# Patient Record
Sex: Female | Born: 1980 | Race: White | Hispanic: No | Smoking: Never smoker
Health system: Southern US, Community
[De-identification: ages and names within clinical notes are randomized; demographics above are authoritative.]

## PROBLEM LIST (undated history)

## (undated) DIAGNOSIS — R112 Nausea with vomiting, unspecified: Secondary | ICD-10-CM

## (undated) DIAGNOSIS — Z9889 Other specified postprocedural states: Secondary | ICD-10-CM

## (undated) DIAGNOSIS — E282 Polycystic ovarian syndrome: Secondary | ICD-10-CM

## (undated) DIAGNOSIS — N201 Calculus of ureter: Secondary | ICD-10-CM

## (undated) HISTORY — DX: Polycystic ovarian syndrome: E28.2

## (undated) HISTORY — PX: OTHER SURGICAL HISTORY: SHX169

## (undated) HISTORY — DX: Nausea with vomiting, unspecified: R11.2

## (undated) HISTORY — DX: Nausea with vomiting, unspecified: Z98.890

---

## 1998-01-11 ENCOUNTER — Other Ambulatory Visit: Admission: RE | Admit: 1998-01-11 | Discharge: 1998-01-11 | Payer: Self-pay | Admitting: Gynecology

## 1998-07-01 ENCOUNTER — Emergency Department (HOSPITAL_COMMUNITY): Admission: EM | Admit: 1998-07-01 | Discharge: 1998-07-01 | Payer: Self-pay | Admitting: Emergency Medicine

## 2002-08-11 ENCOUNTER — Emergency Department (HOSPITAL_COMMUNITY): Admission: EM | Admit: 2002-08-11 | Discharge: 2002-08-11 | Payer: Self-pay | Admitting: *Deleted

## 2002-08-11 ENCOUNTER — Encounter: Payer: Self-pay | Admitting: *Deleted

## 2003-10-05 ENCOUNTER — Other Ambulatory Visit: Admission: RE | Admit: 2003-10-05 | Discharge: 2003-10-05 | Payer: Self-pay | Admitting: Gynecology

## 2004-03-09 ENCOUNTER — Inpatient Hospital Stay (HOSPITAL_COMMUNITY): Admission: AD | Admit: 2004-03-09 | Discharge: 2004-03-09 | Payer: Self-pay | Admitting: Obstetrics and Gynecology

## 2004-05-10 ENCOUNTER — Encounter (INDEPENDENT_AMBULATORY_CARE_PROVIDER_SITE_OTHER): Payer: Self-pay | Admitting: *Deleted

## 2004-05-10 ENCOUNTER — Inpatient Hospital Stay (HOSPITAL_COMMUNITY): Admission: AD | Admit: 2004-05-10 | Discharge: 2004-05-12 | Payer: Self-pay | Admitting: Internal Medicine

## 2004-05-13 ENCOUNTER — Encounter: Admission: RE | Admit: 2004-05-13 | Discharge: 2004-06-12 | Payer: Self-pay | Admitting: Obstetrics and Gynecology

## 2004-07-30 ENCOUNTER — Emergency Department (HOSPITAL_COMMUNITY): Admission: EM | Admit: 2004-07-30 | Discharge: 2004-07-30 | Payer: Self-pay | Admitting: Emergency Medicine

## 2006-01-02 ENCOUNTER — Other Ambulatory Visit: Admission: RE | Admit: 2006-01-02 | Discharge: 2006-01-02 | Payer: Self-pay | Admitting: Obstetrics and Gynecology

## 2008-09-29 ENCOUNTER — Emergency Department (HOSPITAL_COMMUNITY): Admission: EM | Admit: 2008-09-29 | Discharge: 2008-09-29 | Payer: Self-pay | Admitting: Emergency Medicine

## 2010-07-01 HISTORY — PX: BREAST SURGERY: SHX581

## 2010-10-10 LAB — WOUND CULTURE: Gram Stain: NONE SEEN

## 2010-11-13 NOTE — Consult Note (Signed)
[name], [name]             ACCOUNT NO.:  [id number]   MEDICAL RECORD NO.:  [id number]          PATIENT TYPE:  EMS   LOCATION:  ED                           FACILITY:  [facility]   PHYSICIAN:  Lorne Skeens. Hoxworth, M.D.DATE OF BIRTH:  01-May-1981   DATE OF CONSULTATION:  09/29/2008  DATE OF DISCHARGE:                                 CONSULTATION   REFERRING PHYSICIAN:  Nelva Nay, MD.   CHIEF COMPLAINT:  Pain, redness, swelling lower back.   HISTORY OF PRESENT ILLNESS:  Ms. [name] is a generally healthy 30-  year-old female who presents with 4 days of increasing redness, swelling  and discomfort over her lower back.  She has noted several small  pustules.  She has a history of a tattoo across her lower back about 2  weeks ago.  She saw Dr. Timothy Lasso in the office today and two smaller areas  were unroofed but the larger third apparent subcu abscess he was unable  to drain in the office and therefore she is seen in the emergency room  for further evaluation and treatment.  She had not noted any fever or  chills.  She has no personal history of MRSA infection, although her  child had an infection a couple of years ago with MRSA.   PAST MEDICAL HISTORY:  Generally unremarkable without serious illness.  She has had a pilonidal cyst excised.   MEDICATION:  Only birth control pills.   ALLERGIES:  ERYTHROMYCIN.   SOCIAL HISTORY:  She is married, employed.  Denies cigarettes, alcohol.   FAMILY HISTORY:  Noncontributory.   PHYSICAL EXAM:  Temperature is 99.  VITAL SIGNS:  Within the limits.  GENERAL:  She is mildly overweight, in no acute distress.  SKIN:  Pertinent findings were limited to the skin.  Over the lower back  are two small pustules which have been previously opened and about a  centimeter or two of surrounding erythema.  Lower and just to the right  of midline in the low back is another pustule about 5 mm in diameter but  a significant about 10 cm surrounding area  of induration and erythema.   ASSESSMENT/PLAN:  Apparent abscess lower back entirely consistent with  methicillin-resistant Staphylococcus aureus.  The patient has been given  vancomycin 1 gram intravenously.  We will plan irrigation and  debridement under local anesthesia in the emergency room.      Lorne Skeens. Hoxworth, M.D.  Electronically Signed     BTH/MEDQ  D:  09/29/2008  T:  09/29/2008  Job:  [id number]   cc:   Gwen Pounds, MD  Fax: [phone]

## 2010-11-13 NOTE — Op Note (Signed)
[name], [name]             ACCOUNT NO.:  [id number]   MEDICAL RECORD NO.:  [id number]          PATIENT TYPE:  EMS   LOCATION:  ED                           FACILITY:  [facility]   PHYSICIAN:  Lorne Skeens. Hoxworth, M.D.DATE OF BIRTH:  March 12, 1981   DATE OF PROCEDURE:  09/29/2008  DATE OF DISCHARGE:  09/29/2008                               OPERATIVE REPORT   DIAGNOSIS:  Subcutaneous abscess of the back, probable Methicillin-  resistant Staphylococcus aureus.   HISTORY:  This patient is a 30 year old female who presents with 4 days  of increasing redness, swelling and pain in her lower back.  She had a  10-cm area of induration and erythema, with a central small pustule --  consistent with MRSA abscess.  I have recommended proceeding with  incision and drainage under local anesthesia in the emergency room.   DESCRIPTION OF THE PROCEDURE:  The patient had received vancomycin 1  gram IV.  I gave her 6 mg of morphine intravenously.  The lower back was  sterilely prepped and draped.  I sharply and completely excised the  pustule back to normal skin and about a 1 cm deep from the skin.  A  significant-sized abscess cavity, several centimeters in diameter, was  entered and frank purulent material drained.  A little bit of necrotic  subcutaneous was debrided.  The wound was then packed with 1/4-inch  Iodoform gauze and sterilely dressed.   The patient's husband was given instructions on removing the packing and  wound care.  The area of erythema was marked so that he can monitor this  and call if it is getting any larger.   She had a prescription for doxycycline to continue as an outpatient.  I  will follow her up in our office next week.      Lorne Skeens. Hoxworth, M.D.  Electronically Signed     BTH/MEDQ  D:  09/29/2008  T:  09/29/2008  Job:  [id number]   cc:   Gwen Pounds, MD  Fax: [phone]

## 2010-11-16 NOTE — Discharge Summary (Signed)
[name], [name]             ACCOUNT NO.:  [id number]   MEDICAL RECORD NO.:  [id number]          PATIENT TYPE:  INP   LOCATION:  9107                          FACILITY:  [facility]   PHYSICIAN:  Huel Cote, M.D. DATE OF BIRTH:  09-Jan-1981   DATE OF ADMISSION:  05/10/2004  DATE OF DISCHARGE:  05/12/2004                                 DISCHARGE SUMMARY   DISCHARGE DIAGNOSES:  1.  Term pregnancy at 39+ weeks, delivered.  2.  Status post vacuum and forceps-assisted vaginal delivery.   DISCHARGE MEDICATIONS:  1.  Percocet 1-2 tablets p.o. q.4 h. p.r.n.  2.  Motrin 600 mg p.o. q.6 h.  3.  Niferex at 150 1 p.o. q.d.   DISCHARGE FOLLOWUP:  Patient is to follow up in the office in 6 weeks for  her postpartum exam.   HOSPITAL COURSE:  The patient is a 30 year old G1, P0 who was admitted at  39+ weeks given spontaneous labor.  She progressed well after admission.  Prenatal labs are as follows.  A positive, antibody negative, RPR non-  reactive, rubella immune, hepatitis C surface antigen negative, HIV  negative, GC negative, Chlamydia negative, group B strep negative.  Prenatal  care was uncomplicated, except for a fall at 31 weeks, which had no  ramifications for the baby after monitoring.  She progressed with labor at  home and presented at 6 cm dilated and received an epidural anesthesia.   PAST MEDICAL HISTORY:  None.   ALLERGIES:  ERYTHROMYCIN.   PAST GYN HISTORY:  Polycystic ovary syndrome.   PAST SURGICAL HISTORY:  Pilonidal cyst in 1994.   On admission she was afebrile with stable vital signs.  Fetal heart rate was  overall reassuring with occasional variable decelerations.  She reached 8 cm  and completely effaced, -1 station, and had rupture of membranes performed  with slightly malodorous fluid noted.  She then had a slightly protracted  course reaching complete dilation and pushed for approximately 2 hours.  Fetal heart rate was 170 to 180 with some variable  decelerations  occasionally and given maternal exhaustion and the tachycardia of the fetus,  vacuum and forceps-assisted delivery was performed of a viable female  infant.  Apgars were 8 and 9, weight was 7 pounds 14 ounces.  She had an  irregular secondary laceration which was repaired with several 3-0 Vicryl.  Estimated blood loss was 800 cc.  She then was admitted for postpartum care.  She did have some postpartum anemia secondary to her blood loss from the  repair.  Her hemoglobin was 13 and it went down to 7.1.  She was placed on  Unasyn IV given an elevated white blood cell count and the fetal tachycardia  and malodorous fluid and did well.  On postpartum day #2, she remained  afebrile with stable vital signs.  Her perineum was well approximated,  though sore, and she was working on breast-feeding.  She was discharged home  with her IV Unasyn discontinued the day of discharge and with Motrin,  Percocet and Niferex prescriptions.      KR/MEDQ  D:  05/12/2004  T:  05/12/2004  Job:  [id number]

## 2010-11-16 NOTE — Op Note (Signed)
NAME:  [name], [name]             ACCOUNT NO.:  [id number]   MEDICAL RECORD NO.:  [id number]          PATIENT TYPE:  INP   LOCATION:  9107                          FACILITY:  [facility]   PHYSICIAN:  Leighton Roach Meisinger, M.D.DATE OF BIRTH:  1980-10-01   DATE OF PROCEDURE:  05/10/2004  DATE OF DISCHARGE:                                 OPERATIVE REPORT   PROCEDURE NOTE   The patient progressed to complete and pushed for approximately two hours.  Prior to reaching complete, she had a temperature to 100.7 and was started  on Unasyn for probable chorioamnionitis.  Temperature then despite Tylenol  and antibiotics rose to 102.7.  Fetal heart rate gradually increased to the  170's and 180's.  Despite Tylenol and IV fluids, the fetal tachycardia did  not resolve.  Again the patient pushed for approximately two hours and with  persistent fetal tachycardia and the fetal vertex at plus 2, I discussed  assisted deliveries. The risks of vacuum and forceps were discussed with the  patient and the father of the baby and they agreed to proceed.  The patient  had an adequate epidural, the cervix was completely dilated with the vertex  at +2 station and was OA slightly asynclitic to the patient's left.  Bladder  was emptied with a red rubber catheter with a small amount of urine. An M  cup mighty vac was applied and over approximately 15 minutes with  approximately 10 contractions I applied gentle traction while the patient  pushed.  There were several times where the vacuum would begin to lose  suction and I did not feel eventually that I was getting much pull at the  suction.  There were no significant pop-offs.  Fetal heart rate remained in  the 170's to 180's. The vacuum was removed and the patient was allowed to  push several times on her own and was not able to get any further descent. I  discussed using forceps and they agreed.  Simpson forceps were applied with  some difficulty due to the  asynclitism.  On the first contraction with  forceps assistance, we were able to bring the head to the perineum.  The  forceps were removed and the head delivered spontaneously. The mouth and  nares were suctioned with a large amount of fairly thick mucous.  There was  no nuchal cord. The remainder of the infant then delivered atraumatically.  This was a viable female infant with Apgar's of 8 and 9 that ended up  weighing 7 pounds 14 ounces.  The baby was taken to the warmer where it did  well.  The placenta delivered spontaneously and was intact.  The patient had  an irregular second degree laceration.  I put one suture on the patient's  left side to control  a bleeder and then was called to do another delivery.  I returned approximately 20 minutes later to finish the repair.  Again the  bleeding on the left side was controlled first with 3-0 Vicryl.  The  remainder with some difficulty was then repaired as a standard second degree  laceration.  This required several 3-0 Vicryl sutures.  The rectum was  intact.  Local block was added to her epidural due to some discomfort.  The  patient tolerated the delivery and repair very well.     Todd   TDM/MEDQ  D:  05/10/2004  T:  05/10/2004  Job:  [id number]

## 2011-06-28 LAB — OB RESULTS CONSOLE ABO/RH: RH Type: POSITIVE

## 2011-06-28 LAB — OB RESULTS CONSOLE ANTIBODY SCREEN: Antibody Screen: NEGATIVE

## 2011-07-02 NOTE — L&D Delivery Note (Signed)
Delivery Note At 8:06 PM a healthy female was delivered via Vaginal, Spontaneous Delivery (Vtx ant ).  APGAR: 9, 9; weight 8 lb 11 oz (3940 g).   Placenta status: Intact, Spontaneous.  Cord: 3 Vs with the following complications: None.  Cord pH: Not done  Anesthesia: Epidural  Episiotomy: None Lacerations: 2nd degree;Perineal Suture Repair: vicryl rapide Est. Blood Loss (mL): 350  Mom to postpartum.  Baby to nursery-stable.  Seerat Peaden,[name] 01/20/2012, 8:37 PM

## 2011-07-03 LAB — OB RESULTS CONSOLE GC/CHLAMYDIA
Chlamydia: NEGATIVE
Gonorrhea: NEGATIVE

## 2011-12-20 LAB — OB RESULTS CONSOLE GBS: GBS: NEGATIVE

## 2012-01-14 ENCOUNTER — Telehealth (HOSPITAL_COMMUNITY): Payer: Self-pay | Admitting: *Deleted

## 2012-01-14 ENCOUNTER — Encounter (HOSPITAL_COMMUNITY): Payer: Self-pay | Admitting: *Deleted

## 2012-01-14 NOTE — Telephone Encounter (Signed)
Preadmission screen  

## 2012-01-15 ENCOUNTER — Telehealth (HOSPITAL_COMMUNITY): Payer: Self-pay | Admitting: *Deleted

## 2012-01-15 ENCOUNTER — Encounter (HOSPITAL_COMMUNITY): Payer: Self-pay | Admitting: *Deleted

## 2012-01-15 NOTE — Telephone Encounter (Signed)
Preadmission screen  

## 2012-01-17 ENCOUNTER — Other Ambulatory Visit: Payer: Self-pay | Admitting: Obstetrics & Gynecology

## 2012-01-20 ENCOUNTER — Inpatient Hospital Stay (HOSPITAL_COMMUNITY)
Admission: RE | Admit: 2012-01-20 | Discharge: 2012-01-22 | DRG: 775 | Disposition: A | Discharge status: Home / Self Care | Payer: 59 | Source: Ambulatory Visit | Attending: Obstetrics & Gynecology | Admitting: Obstetrics & Gynecology

## 2012-01-20 ENCOUNTER — Encounter (HOSPITAL_COMMUNITY): Payer: Self-pay | Admitting: Anesthesiology

## 2012-01-20 ENCOUNTER — Encounter (HOSPITAL_COMMUNITY): Payer: Self-pay

## 2012-01-20 ENCOUNTER — Inpatient Hospital Stay (HOSPITAL_COMMUNITY): Payer: 59 | Admitting: Anesthesiology

## 2012-01-20 DIAGNOSIS — IMO0002 Reserved for concepts with insufficient information to code with codable children: Secondary | ICD-10-CM | POA: Diagnosis present

## 2012-01-20 DIAGNOSIS — O9903 Anemia complicating the puerperium: Secondary | ICD-10-CM | POA: Diagnosis not present

## 2012-01-20 DIAGNOSIS — D649 Anemia, unspecified: Secondary | ICD-10-CM | POA: Diagnosis not present

## 2012-01-20 DIAGNOSIS — E669 Obesity, unspecified: Secondary | ICD-10-CM | POA: Diagnosis present

## 2012-01-20 LAB — CBC
MCH: 29.3 pg (ref 26.0–34.0)
MCHC: 33.4 g/dL (ref 30.0–36.0)
MCV: 87.5 fL (ref 78.0–100.0)
Platelets: 198 10*3/uL (ref 150–400)
RBC: 3.93 MIL/uL (ref 3.87–5.11)
RDW: 14.2 % (ref 11.5–15.5)

## 2012-01-20 LAB — RPR: RPR Ser Ql: NONREACTIVE

## 2012-01-20 MED ORDER — OXYTOCIN 40 UNITS IN LACTATED RINGERS INFUSION - SIMPLE MED
62.5000 mL/h | Freq: Once | INTRAVENOUS | Status: AC
  Administered 2012-01-20: 62.5 mL/h via INTRAVENOUS
  Filled 2012-01-20: qty 1000

## 2012-01-20 MED ORDER — OXYTOCIN BOLUS FROM INFUSION
250.0000 mL | Freq: Once | INTRAVENOUS | Status: DC
  Filled 2012-01-20: qty 500

## 2012-01-20 MED ORDER — IBUPROFEN 600 MG PO TABS
600.0000 mg | ORAL_TABLET | Freq: Four times a day (QID) | ORAL | Status: DC
  Administered 2012-01-21 – 2012-01-22 (×6): 600 mg via ORAL
  Filled 2012-01-20 (×6): qty 1

## 2012-01-20 MED ORDER — ONDANSETRON HCL 4 MG/2ML IJ SOLN: 4.0000 mg | Freq: Four times a day (QID) | INTRAMUSCULAR | Status: DC | PRN

## 2012-01-20 MED ORDER — LACTATED RINGERS IV SOLN
500.0000 mL | Freq: Once | INTRAVENOUS | Status: AC
  Administered 2012-01-20: 500 mL via INTRAVENOUS

## 2012-01-20 MED ORDER — LIDOCAINE HCL (PF) 1 % IJ SOLN
30.0000 mL | INTRAMUSCULAR | Status: DC | PRN
  Administered 2012-01-20: 30 mL via SUBCUTANEOUS
  Filled 2012-01-20: qty 30

## 2012-01-20 MED ORDER — LACTATED RINGERS IV SOLN
500.0000 mL | INTRAVENOUS | Status: DC | PRN
  Administered 2012-01-20: 300 mL via INTRAVENOUS

## 2012-01-20 MED ORDER — DIPHENHYDRAMINE HCL 25 MG PO CAPS: 25.0000 mg | ORAL_CAPSULE | Freq: Four times a day (QID) | ORAL | Status: DC | PRN

## 2012-01-20 MED ORDER — TETANUS-DIPHTH-ACELL PERTUSSIS 5-2.5-18.5 LF-MCG/0.5 IM SUSP
0.5000 mL | Freq: Once | INTRAMUSCULAR | Status: AC
  Administered 2012-01-21: 0.5 mL via INTRAMUSCULAR
  Filled 2012-01-20: qty 0.5

## 2012-01-20 MED ORDER — DIPHENHYDRAMINE HCL 50 MG/ML IJ SOLN
12.5000 mg | INTRAMUSCULAR | Status: DC | PRN
  Administered 2012-01-20: 12.5 mg via INTRAVENOUS
  Filled 2012-01-20: qty 1

## 2012-01-20 MED ORDER — SIMETHICONE 80 MG PO CHEW: 80.0000 mg | CHEWABLE_TABLET | ORAL | Status: DC | PRN

## 2012-01-20 MED ORDER — BENZOCAINE-MENTHOL 20-0.5 % EX AERO
1.0000 "application " | INHALATION_SPRAY | CUTANEOUS | Status: DC | PRN
  Administered 2012-01-22: 1 via TOPICAL
  Filled 2012-01-20: qty 56

## 2012-01-20 MED ORDER — PRENATAL MULTIVITAMIN CH
1.0000 | ORAL_TABLET | Freq: Every day | ORAL | Status: DC
  Administered 2012-01-21 – 2012-01-22 (×2): 1 via ORAL
  Filled 2012-01-20 (×2): qty 1

## 2012-01-20 MED ORDER — OXYCODONE-ACETAMINOPHEN 5-325 MG PO TABS
1.0000 | ORAL_TABLET | ORAL | Status: DC | PRN
  Administered 2012-01-20: 1 via ORAL
  Filled 2012-01-20: qty 1

## 2012-01-20 MED ORDER — FENTANYL 2.5 MCG/ML BUPIVACAINE 1/10 % EPIDURAL INFUSION (WH - ANES)
14.0000 mL/h | INTRAMUSCULAR | Status: DC
  Administered 2012-01-20 (×2): 14 mL/h via EPIDURAL
  Filled 2012-01-20 (×2): qty 60

## 2012-01-20 MED ORDER — PHENYLEPHRINE 40 MCG/ML (10ML) SYRINGE FOR IV PUSH (FOR BLOOD PRESSURE SUPPORT)
80.0000 ug | PREFILLED_SYRINGE | INTRAVENOUS | Status: DC | PRN
  Filled 2012-01-20: qty 5

## 2012-01-20 MED ORDER — ONDANSETRON HCL 4 MG PO TABS: 4.0000 mg | ORAL_TABLET | ORAL | Status: DC | PRN

## 2012-01-20 MED ORDER — OXYCODONE-ACETAMINOPHEN 5-325 MG PO TABS: 1.0000 | ORAL_TABLET | ORAL | Status: DC | PRN

## 2012-01-20 MED ORDER — TERBUTALINE SULFATE 1 MG/ML IJ SOLN: 0.2500 mg | Freq: Once | INTRAMUSCULAR | Status: DC | PRN

## 2012-01-20 MED ORDER — CITRIC ACID-SODIUM CITRATE 334-500 MG/5ML PO SOLN: 30.0000 mL | ORAL | Status: DC | PRN

## 2012-01-20 MED ORDER — LACTATED RINGERS IV SOLN
INTRAVENOUS | Status: DC
  Administered 2012-01-20 (×3): via INTRAVENOUS

## 2012-01-20 MED ORDER — SENNOSIDES-DOCUSATE SODIUM 8.6-50 MG PO TABS
2.0000 | ORAL_TABLET | Freq: Every day | ORAL | Status: DC
  Administered 2012-01-21: 2 via ORAL

## 2012-01-20 MED ORDER — WITCH HAZEL-GLYCERIN EX PADS: 1.0000 "application " | MEDICATED_PAD | CUTANEOUS | Status: DC | PRN

## 2012-01-20 MED ORDER — ACETAMINOPHEN 325 MG PO TABS: 650.0000 mg | ORAL_TABLET | ORAL | Status: DC | PRN

## 2012-01-20 MED ORDER — EPHEDRINE 5 MG/ML INJ
10.0000 mg | INTRAVENOUS | Status: DC | PRN
  Filled 2012-01-20: qty 4

## 2012-01-20 MED ORDER — ZOLPIDEM TARTRATE 5 MG PO TABS: 5.0000 mg | ORAL_TABLET | Freq: Every evening | ORAL | Status: DC | PRN

## 2012-01-20 MED ORDER — ONDANSETRON HCL 4 MG/2ML IJ SOLN: 4.0000 mg | INTRAMUSCULAR | Status: DC | PRN

## 2012-01-20 MED ORDER — LIDOCAINE HCL (PF) 1 % IJ SOLN
INTRAMUSCULAR | Status: DC | PRN
  Administered 2012-01-20 (×3): 4 mL

## 2012-01-20 MED ORDER — PHENYLEPHRINE 40 MCG/ML (10ML) SYRINGE FOR IV PUSH (FOR BLOOD PRESSURE SUPPORT): 80.0000 ug | PREFILLED_SYRINGE | INTRAVENOUS | Status: DC | PRN

## 2012-01-20 MED ORDER — OXYTOCIN 40 UNITS IN LACTATED RINGERS INFUSION - SIMPLE MED
1.0000 m[IU]/min | INTRAVENOUS | Status: DC
  Administered 2012-01-20: 2 m[IU]/min via INTRAVENOUS

## 2012-01-20 MED ORDER — IBUPROFEN 600 MG PO TABS: 600.0000 mg | ORAL_TABLET | Freq: Four times a day (QID) | ORAL | Status: DC | PRN

## 2012-01-20 MED ORDER — DIBUCAINE 1 % RE OINT: 1.0000 "application " | TOPICAL_OINTMENT | RECTAL | Status: DC | PRN

## 2012-01-20 MED ORDER — LANOLIN HYDROUS EX OINT: TOPICAL_OINTMENT | CUTANEOUS | Status: DC | PRN

## 2012-01-20 MED ORDER — FLEET ENEMA 7-19 GM/118ML RE ENEM: 1.0000 | ENEMA | RECTAL | Status: DC | PRN

## 2012-01-20 MED ORDER — EPHEDRINE 5 MG/ML INJ
10.0000 mg | INTRAVENOUS | Status: DC | PRN
  Administered 2012-01-20: 10 mg via INTRAVENOUS

## 2012-01-20 NOTE — H&P (Signed)
[name] is a 31 y.o. female G2P1001 [redacted]w[redacted]d presenting for Induction re difficult delivery G1.  HPP:  Normal pregnancy, no Cx.  OB History    Grav Para Term Preterm Abortions TAB SAB Ect Mult Living   2 1 1       1      Past Medical History  Diagnosis Date  . Obese   . PCOS (polycystic ovarian syndrome)   . PONV (postoperative nausea and vomiting)    Past Surgical History  Procedure Date  . Pylonidal cyst   . Breast surgery 2012    reduction   Family History: family history includes Cancer in her maternal grandmother and Diabetes in her paternal grandfather and paternal grandmother. Social History:  reports that she has never smoked. She has never used smokeless tobacco. She reports that she does not drink alcohol or use illicit drugs. Current facility-administered medications:acetaminophen (TYLENOL) tablet 650 mg, 650 mg, Oral, Q4H PRN, Lenoard Aden, MD;  citric acid-sodium citrate (ORACIT) solution 30 mL, 30 mL, Oral, Q2H PRN, Lenoard Aden, MD;  diphenhydrAMINE (BENADRYL) injection 12.5 mg, 12.5 mg, Intravenous, Q15 min PRN, Lenoard Aden, MD, 12.5 mg at 01/20/12 1618;  ePHEDrine injection 10 mg, 10 mg, Intravenous, PRN, Lenoard Aden, MD, 10 mg at 01/20/12 1401 ePHEDrine injection 10 mg, 10 mg, Intravenous, PRN, Lenoard Aden, MD;  fentaNYL 2.5 mcg/ml w/bupivacaine 0.1% epidural (60 ml syringe), 14 mL/hr, Epidural, Continuous, Lenoard Aden, MD, Last Rate: 14 mL/hr at 01/20/12 1337, 14 mL/hr at 01/20/12 1337;  ibuprofen (ADVIL,MOTRIN) tablet 600 mg, 600 mg, Oral, Q6H PRN, Lenoard Aden, MD lactated ringers infusion 500 mL, 500 mL, Intravenous, Once, Lenoard Aden, MD, Last Rate: 999 mL/hr at 01/20/12 1305, 500 mL at 01/20/12 1305;  lactated ringers infusion 500-1,000 mL, 500-1,000 mL, Intravenous, PRN, Lenoard Aden, MD, Last Rate: 1,000 mL/hr at 01/20/12 1413, 300 mL at 01/20/12 1413;  lactated ringers infusion, , Intravenous, Continuous, Lenoard Aden, MD, Last Rate: 125 mL/hr at 01/20/12 1316 lidocaine (XYLOCAINE) 1 % injection 30 mL, 30 mL, Subcutaneous, PRN, Lenoard Aden, MD;  ondansetron [facility]) injection 4 mg, 4 mg, Intravenous, Q6H PRN, Lenoard Aden, MD;  oxyCODONE-acetaminophen (PERCOCET/ROXICET) 5-325 MG per tablet 1-2 tablet, 1-2 tablet, Oral, Q3H PRN, Lenoard Aden, MD;  oxytocin (PITOCIN) IV BOLUS FROM BAG, 250 mL, Intravenous, Once, Lenoard Aden, MD oxytocin (PITOCIN) IV infusion 40 units in LR 1000 mL, 62.5 mL/hr, Intravenous, Once, Lenoard Aden, MD;  oxytocin (PITOCIN) IV infusion 40 units in LR 1000 mL, 1-40 milli-units/min, Intravenous, Titrated, Lenoard Aden, MD, Last Rate: 24 mL/hr at 01/20/12 1601, 16 milli-units/min at 01/20/12 1601;  phenylephrine injection 80 mcg, 80 mcg, Intravenous, PRN, Lenoard Aden, MD phenylephrine injection 80 mcg, 80 mcg, Intravenous, PRN, Lenoard Aden, MD;  sodium phosphate (FLEET) 7-19 GM/118ML enema 1 enema, 1 enema, Rectal, PRN, Lenoard Aden, MD;  terbutaline (BRETHINE) injection 0.25 mg, 0.25 mg, Subcutaneous, Once PRN, Lenoard Aden, MD Facility-Administered Medications Ordered in Other Encounters: lidocaine (XYLOCAINE) 1 % injection, , , PRN, Dana Allan, MD, 4 mL at 01/20/12 1336 Allergies  Allergen Reactions  . Erythromycin Nausea And Vomiting  . Other Other (See Comments)    Banana peppers, inability to move followed by vomitting    Dilation: 4 Effacement (%): 70;80 Station: -2 Exam by:: s.cole,rnc Blood pressure 120/71, pulse 94, temperature 98.3 F (36.8 C), temperature source Oral, resp. rate 18, height 5\' 4"  (1.626  m), weight 121.337 kg (267 lb 8 oz), SpO2 100.00%.  FHR monitoring reassuring, BL 140's, accelerations present, no deceleration. VE 4/75%/VTx/-1 AROM clear AF +++  HPP: There is no problem list on file for this patient.   Prenatal labs: ABO, Rh: --/--/A POS, A POS (07/22 0720) Antibody: NEG (07/22 0720) Rubella:   Immune RPR: NON REACTIVE (07/22 0719)  HBsAg: Negative (12/28 0000)  HIV: Non-reactive (12/28 0000)  Genetic testing: declined [place] anato: wnl 1 hr GTT: 134 wnl GBS: Negative (06/21 0000)   Assessment/Plan: G2P1 39 6/7 wks obesity.  Difficult delivery in G1, requesting induction.  Pitocin/AROM.  Monitoring.  Expectant management towards vaginal delivery.  Pain well controled with Epidural.   Joy Kindel,Russell 01/20/2012, 5:33 PM

## 2012-01-20 NOTE — Anesthesia Procedure Notes (Signed)
Epidural Patient location during procedure: OB Start time: 01/20/2012 1:28 PM Reason for block: procedure for pain  Staffing Performed by: anesthesiologist   Preanesthetic Checklist Completed: patient identified, site marked, surgical consent, pre-op evaluation, timeout performed, IV checked, risks and benefits discussed and monitors and equipment checked  Epidural Patient position: sitting Prep: site prepped and draped and DuraPrep Patient monitoring: continuous pulse ox and blood pressure Approach: midline Injection technique: LOR air  Needle:  Needle type: Tuohy  Needle gauge: 17 G Needle length: 9 cm Needle insertion depth: 8 cm Catheter type: closed end flexible Catheter size: 19 Gauge Catheter at skin depth: 13 cm Test dose: negative  Assessment Events: blood not aspirated, injection not painful, no injection resistance, negative IV test and no paresthesia  Additional Notes Discussed risk of headache, infection, bleeding, nerve injury and failed or incomplete block.  Patient voices understanding and wishes to proceed.

## 2012-01-20 NOTE — Anesthesia Preprocedure Evaluation (Signed)
Anesthesia Evaluation  Patient identified by MRN, date of birth, ID band Patient awake    Reviewed: Allergy & Precautions, H&P , NPO status , Patient's Chart, lab work & pertinent test results, reviewed documented beta blocker date and time   History of Anesthesia Complications (+) PONV  Airway Mallampati: III TM Distance: >3 FB Neck ROM: full    Dental  (+) Teeth Intact   Pulmonary neg pulmonary ROS,  breath sounds clear to auscultation        Cardiovascular negative cardio ROS  Rhythm:regular Rate:Normal     Neuro/Psych negative neurological ROS  negative psych ROS   GI/Hepatic negative GI ROS, Neg liver ROS,   Endo/Other  Morbid obesity  Renal/GU negative Renal ROS     Musculoskeletal   Abdominal   Peds  Hematology negative hematology ROS (+)   Anesthesia Other Findings   Reproductive/Obstetrics (+) Pregnancy                           Anesthesia Physical Anesthesia Plan  ASA: III  Anesthesia Plan: Epidural   Post-op Pain Management:    Induction:   Airway Management Planned:   Additional Equipment:   Intra-op Plan:   Post-operative Plan:   Informed Consent: I have reviewed the patients History and Physical, chart, labs and discussed the procedure including the risks, benefits and alternatives for the proposed anesthesia with the patient or authorized representative who has indicated his/her understanding and acceptance.     Plan Discussed with:   Anesthesia Plan Comments:         Anesthesia Quick Evaluation

## 2012-01-21 ENCOUNTER — Inpatient Hospital Stay (HOSPITAL_COMMUNITY): Admission: AD | Admit: 2012-01-21 | Payer: Self-pay | Source: Ambulatory Visit | Admitting: Obstetrics & Gynecology

## 2012-01-21 ENCOUNTER — Encounter (HOSPITAL_COMMUNITY): Payer: Self-pay

## 2012-01-21 DIAGNOSIS — IMO0002 Reserved for concepts with insufficient information to code with codable children: Secondary | ICD-10-CM | POA: Diagnosis present

## 2012-01-21 LAB — CBC
HCT: 26.8 % — ABNORMAL LOW (ref 36.0–46.0)
Hemoglobin: 8.8 g/dL — ABNORMAL LOW (ref 12.0–15.0)
WBC: 17.4 10*3/uL — ABNORMAL HIGH (ref 4.0–10.5)

## 2012-01-21 MED ORDER — OXYTOCIN 40 UNITS IN LACTATED RINGERS INFUSION - SIMPLE MED: 1.0000 m[IU]/min | INTRAVENOUS | Status: DC

## 2012-01-21 MED ORDER — TERBUTALINE SULFATE 1 MG/ML IJ SOLN: 0.2500 mg | Freq: Once | INTRAMUSCULAR | Status: AC | PRN

## 2012-01-21 MED ORDER — DOCUSATE SODIUM 100 MG PO CAPS
100.0000 mg | ORAL_CAPSULE | Freq: Every day | ORAL | Status: DC
  Administered 2012-01-21: 100 mg via ORAL
  Filled 2012-01-21: qty 1

## 2012-01-21 MED ORDER — POLYSACCHARIDE IRON COMPLEX 150 MG PO CAPS
150.0000 mg | ORAL_CAPSULE | Freq: Every day | ORAL | Status: DC
  Administered 2012-01-21: 150 mg via ORAL
  Filled 2012-01-21 (×2): qty 1

## 2012-01-21 NOTE — Progress Notes (Signed)
PPD 1 SVD  S:  Reports feeling well.             Tolerating po/ No nausea or vomiting             Bleeding is moderate, was heavy last night w/ clots.             Pain controlled with Motrin and percocet.             Up ad lib / ambulatory / voiding well.   Newborn  Information for the patient's newborn:  Schmid-Zinn, Girl Jodiann [[id number]]  female  bottle feeding (hx breast redux)   O:  A & O x 3 NAD             VS:  Filed Vitals:   01/20/12 2132 01/20/12 2243 01/21/12 0020 01/21/12 0404  BP: 124/83 108/74 118/76 97/67  Pulse: 117 116 106 91  Temp: 98.3 F (36.8 C) 97.9 F (36.6 C) 97.9 F (36.6 C) 97.8 F (36.6 C)  TempSrc: Oral Oral Oral Oral  Resp: 20 20 18 18   Height:      Weight:      SpO2:  97%      LABS:  Basename 01/21/12 0525 01/20/12 0719  WBC 17.4* 14.3*  HGB 8.8* 11.5*  HCT 26.8* 34.4*  PLT 177 198    Blood type: --/--/A POS, A POS (07/22 0720)  Rubella: Immune (12/28 0000)   I&O: I/O last 3 completed shifts: In: -  Out: 550 [Urine:200; Blood:350]      Lungs: Clear and unlabored  Heart: regular rate and rhythm / no murmurs  Abdomen: soft, non-tender, non-distended              Fundus: firm, non-tender, @U   Perineum: repair intact, no edema  Lochia: small, no clots w/ vigorous massage  Extremities: +1 edema, no calf pain or tenderness, neg Homans    A/P: PPD # 1 30 y.o., [id number]    Principal Problem:  *Postpartum care following vaginal delivery (7/22) Active Problems:  Maternal anemia complicating pregnancy, childbirth, or the puerperium   Doing well - stable status  Routine post partum orders  Started on oral FE and colace  Anticipate discharge home in AM.   PAUL,DANIELA, CNM, MSN 01/21/2012, 9:32 AM

## 2012-01-22 MED ORDER — IBUPROFEN 600 MG PO TABS: 600.0000 mg | ORAL_TABLET | Freq: Four times a day (QID) | ORAL | Status: AC

## 2012-01-22 MED ORDER — OXYCODONE-ACETAMINOPHEN 5-325 MG PO TABS: 1.0000 | ORAL_TABLET | ORAL | Status: AC | PRN

## 2012-01-22 NOTE — Discharge Summary (Signed)
Reviewed and agree with note V.Trask Vosler, MD 

## 2012-01-22 NOTE — Discharge Summary (Signed)
Obstetric Discharge Summary   Reason for Admission: induction of labor Prenatal Procedures: none Intrapartum Procedures: spontaneous vaginal delivery Postpartum Procedures: none Complications-Operative and Postpartum: 2nd  degree perineal laceration Hemoglobin  Date Value Range Status  01/21/2012 8.8* 12.0 - 15.0 g/dL Final     DELTA CHECK NOTED     REPEATED TO VERIFY     HCT  Date Value Range Status  01/21/2012 26.8* 36.0 - 46.0 % Final    Physical Exam:  General: alert, cooperative and no distress Lochia: appropriate Uterine Fundus: firm Incision: healing well DVT Evaluation: No evidence of DVT seen on physical exam.  Discharge Diagnoses: Term Pregnancy-delivered  Discharge Information: Date: 01/22/2012 Activity: pelvic rest Diet: routine Medications: PNV, Ibuprofen, Colace, Iron and Percocet Condition: stable Instructions: refer to practice specific booklet Discharge to: home Follow-up Information    Follow up with [facility]. Schedule an appointment as soon as possible for a visit in 6 weeks.         Newborn Data: Live born female  Birth Weight: 8 lb 11 oz (3941 g) APGAR: 9, 9  Home with mother.  Marlinda Mike 01/22/2012, 9:13 AM

## 2012-01-22 NOTE — Progress Notes (Signed)
Patient ID: [name], female   DOB: 02-09-1981, 31 y.o.   MRN: [id number]  PPD 2 SVD  S:  Reports feeling well- ready to go home             Tolerating po/ No nausea or vomiting             Bleeding is light             Pain controlled with motrin and percocet             Up ad lib / ambulatory  Newborn breast feeding     O:  A & O x 3              VS: Blood pressure 118/79, pulse 97, temperature 98 F (36.7 C), temperature source Oral, resp. rate 20, height 5\' 4"  (1.626 m), weight 121.337 kg (267 lb 8 oz), SpO2 97.00%, unknown if currently breastfeeding.  Abdomen: soft, non-tender, non-distended              Fundus: firm, non-tender, U-1  Perineum: no edema  Lochia: light  Extremities: trace pedal edema, no calf pain or tenderness    A: PPD # 2   Doing well - stable status  P:  Routine post partum orders  discharge home  Marlinda Mike CNM, MSN 01/22/2012, 9:11 AM

## 2012-01-23 LAB — TYPE AND SCREEN: Unit division: 0

## 2012-01-23 NOTE — Anesthesia Postprocedure Evaluation (Signed)
Patient stable following vaginal delivery.  

## 2012-05-14 ENCOUNTER — Encounter (HOSPITAL_COMMUNITY): Payer: Self-pay | Admitting: *Deleted

## 2012-05-18 ENCOUNTER — Encounter (HOSPITAL_COMMUNITY): Payer: Self-pay | Admitting: Pharmacist

## 2012-05-20 ENCOUNTER — Other Ambulatory Visit: Payer: Self-pay | Admitting: Obstetrics & Gynecology

## 2012-07-24 ENCOUNTER — Encounter (HOSPITAL_COMMUNITY): Admission: RE | Payer: Self-pay | Source: Ambulatory Visit

## 2012-07-24 ENCOUNTER — Ambulatory Visit (HOSPITAL_COMMUNITY): Admission: RE | Admit: 2012-07-24 | Payer: 59 | Source: Ambulatory Visit | Admitting: Obstetrics & Gynecology

## 2012-07-24 SURGERY — DILATATION & CURETTAGE/HYSTEROSCOPY WITH ESSURE
Anesthesia: Choice

## 2014-05-02 ENCOUNTER — Encounter (HOSPITAL_COMMUNITY): Payer: Self-pay | Admitting: *Deleted

## 2015-08-30 DIAGNOSIS — E559 Vitamin D deficiency, unspecified: Secondary | ICD-10-CM | POA: Insufficient documentation

## 2015-08-30 DIAGNOSIS — F339 Major depressive disorder, recurrent, unspecified: Secondary | ICD-10-CM | POA: Insufficient documentation

## 2015-09-05 DIAGNOSIS — F411 Generalized anxiety disorder: Secondary | ICD-10-CM | POA: Insufficient documentation

## 2015-09-05 DIAGNOSIS — F410 Panic disorder [episodic paroxysmal anxiety] without agoraphobia: Secondary | ICD-10-CM | POA: Insufficient documentation

## 2015-11-25 ENCOUNTER — Emergency Department (HOSPITAL_COMMUNITY)
Admission: EM | Admit: 2015-11-25 | Discharge: 2015-11-25 | Disposition: A | Discharge status: Home / Self Care | Payer: 59 | Attending: Emergency Medicine | Admitting: Emergency Medicine

## 2015-11-25 ENCOUNTER — Encounter (HOSPITAL_COMMUNITY): Payer: Self-pay | Admitting: Emergency Medicine

## 2015-11-25 ENCOUNTER — Emergency Department (HOSPITAL_COMMUNITY): Payer: 59

## 2015-11-25 DIAGNOSIS — R109 Unspecified abdominal pain: Secondary | ICD-10-CM | POA: Diagnosis present

## 2015-11-25 DIAGNOSIS — Z79899 Other long term (current) drug therapy: Secondary | ICD-10-CM | POA: Diagnosis not present

## 2015-11-25 DIAGNOSIS — N200 Calculus of kidney: Secondary | ICD-10-CM | POA: Diagnosis not present

## 2015-11-25 LAB — BASIC METABOLIC PANEL
Anion gap: 13 (ref 5–15)
BUN: 13 mg/dL (ref 6–20)
CALCIUM: 9.5 mg/dL (ref 8.9–10.3)
CO2: 22 mmol/L (ref 22–32)
CREATININE: 1.02 mg/dL — AB (ref 0.44–1.00)
Chloride: 102 mmol/L (ref 101–111)
GFR calc Af Amer: >60 mL/min (ref >60)
GFR calc non Af Amer: >60 mL/min (ref >60)
GLUCOSE: 117 mg/dL — AB (ref 65–99)
Potassium: 3.9 mmol/L (ref 3.5–5.1)
Sodium: 137 mmol/L (ref 135–145)

## 2015-11-25 LAB — LITHIUM LEVEL: Lithium Lvl: <0.06 mmol/L — ABNORMAL LOW (ref 0.60–1.20)

## 2015-11-25 LAB — URINALYSIS, ROUTINE W REFLEX MICROSCOPIC
GLUCOSE, UA: NEGATIVE mg/dL
Ketones, ur: >80 mg/dL — AB
Nitrite: NEGATIVE
PH: 5.5 (ref 5.0–8.0)
Protein, ur: 100 mg/dL — AB
Specific Gravity, Urine: 1.027 (ref 1.005–1.030)

## 2015-11-25 LAB — CBC WITH DIFFERENTIAL/PLATELET
BASOS PCT: 0 %
Basophils Absolute: 0 10*3/uL (ref 0.0–0.1)
Eosinophils Absolute: 0 10*3/uL (ref 0.0–0.7)
Eosinophils Relative: 0 %
HEMATOCRIT: 39.9 % (ref 36.0–46.0)
Hemoglobin: 13.1 g/dL (ref 12.0–15.0)
LYMPHS PCT: 12 %
Lymphs Abs: 1.7 10*3/uL (ref 0.7–4.0)
MCH: 27.8 pg (ref 26.0–34.0)
MCHC: 32.8 g/dL (ref 30.0–36.0)
MCV: 84.7 fL (ref 78.0–100.0)
MONO ABS: 0.5 10*3/uL (ref 0.1–1.0)
MONOS PCT: 4 %
NEUTROS ABS: 12 10*3/uL — AB (ref 1.7–7.7)
Neutrophils Relative %: 84 %
Platelets: 311 10*3/uL (ref 150–400)
RBC: 4.71 MIL/uL (ref 3.87–5.11)
RDW: 13.7 % (ref 11.5–15.5)
WBC: 14.3 10*3/uL — ABNORMAL HIGH (ref 4.0–10.5)

## 2015-11-25 LAB — URINE MICROSCOPIC-ADD ON

## 2015-11-25 LAB — POC URINE PREG, ED: Preg Test, Ur: NEGATIVE

## 2015-11-25 MED ORDER — OXYCODONE-ACETAMINOPHEN 5-325 MG PO TABS
1.0000 | ORAL_TABLET | Freq: Four times a day (QID) | ORAL | Status: DC | PRN
Start: 2015-11-25 — End: 2018-12-10

## 2015-11-25 MED ORDER — IBUPROFEN 800 MG PO TABS
800.0000 mg | ORAL_TABLET | Freq: Once | ORAL | Status: AC
  Administered 2015-11-25: 800 mg via ORAL
  Filled 2015-11-25: qty 1

## 2015-11-25 MED ORDER — HYDROMORPHONE HCL 1 MG/ML IJ SOLN
1.0000 mg | Freq: Once | INTRAMUSCULAR | Status: AC
Start: 2015-11-25 — End: 2015-11-25
  Administered 2015-11-25: 1 mg via INTRAVENOUS
  Filled 2015-11-25: qty 1

## 2015-11-25 MED ORDER — CIPROFLOXACIN HCL 500 MG PO TABS: 500.0000 mg | ORAL_TABLET | Freq: Two times a day (BID) | ORAL | Status: DC

## 2015-11-25 MED ORDER — HYDROCODONE-ACETAMINOPHEN 5-325 MG PO TABS
1.0000 | ORAL_TABLET | Freq: Once | ORAL | Status: AC
Start: 2015-11-25 — End: 2015-11-25
  Administered 2015-11-25: 1 via ORAL
  Filled 2015-11-25: qty 1

## 2015-11-25 MED ORDER — SODIUM CHLORIDE 0.9 % IV BOLUS (SEPSIS)
1000.0000 mL | Freq: Once | INTRAVENOUS | Status: AC
  Administered 2015-11-25: 1000 mL via INTRAVENOUS

## 2015-11-25 MED ORDER — ONDANSETRON HCL 4 MG/2ML IJ SOLN
4.0000 mg | Freq: Once | INTRAMUSCULAR | Status: AC
  Administered 2015-11-25: 4 mg via INTRAVENOUS
  Filled 2015-11-25: qty 2

## 2015-11-25 MED ORDER — HYDROMORPHONE HCL 1 MG/ML IJ SOLN
1.0000 mg | Freq: Once | INTRAMUSCULAR | Status: AC
  Administered 2015-11-25: 1 mg via INTRAVENOUS
  Filled 2015-11-25: qty 1

## 2015-11-25 MED ORDER — TAMSULOSIN HCL 0.4 MG PO CAPS: 0.4000 mg | ORAL_CAPSULE | Freq: Every day | ORAL | Status: DC

## 2015-11-25 NOTE — ED Notes (Signed)
Pt. Is unable to urinate at this time.  

## 2015-11-25 NOTE — ED Provider Notes (Signed)
Care assumed from Dr. Anitra Lauth at 1700 with plan for f/u UA for signs of infection.   Results:  BP 131/79 mmHg  Pulse 65  Temp(Src) 98.1 F (36.7 C) (Oral)  Resp 18  SpO2 96%  Results for orders placed or performed during the hospital encounter of 11/25/15  CBC with Differential/Platelet  Result Value Ref Range   WBC 14.3 (H) 4.0 - 10.5 K/uL   RBC 4.71 3.87 - 5.11 MIL/uL   Hemoglobin 13.1 12.0 - 15.0 g/dL   HCT 78.2 95.6 - 21.3 %   MCV 84.7 78.0 - 100.0 fL   MCH 27.8 26.0 - 34.0 pg   MCHC 32.8 30.0 - 36.0 g/dL   RDW 08.6 57.8 - 46.9 %   Platelets 311 150 - 400 K/uL   Neutrophils Relative % 84 %   Neutro Abs 12.0 (H) 1.7 - 7.7 K/uL   Lymphocytes Relative 12 %   Lymphs Abs 1.7 0.7 - 4.0 K/uL   Monocytes Relative 4 %   Monocytes Absolute 0.5 0.1 - 1.0 K/uL   Eosinophils Relative 0 %   Eosinophils Absolute 0.0 0.0 - 0.7 K/uL   Basophils Relative 0 %   Basophils Absolute 0.0 0.0 - 0.1 K/uL  Basic metabolic panel  Result Value Ref Range   Sodium 137 135 - 145 mmol/L   Potassium 3.9 3.5 - 5.1 mmol/L   Chloride 102 101 - 111 mmol/L   CO2 22 22 - 32 mmol/L   Glucose, Bld 117 (H) 65 - 99 mg/dL   BUN 13 6 - 20 mg/dL   Creatinine, Ser 6.29 (H) 0.44 - 1.00 mg/dL   Calcium 9.5 8.9 - 52.8 mg/dL   GFR calc non Af Amer >60 >60 mL/min   GFR calc Af Amer >60 >60 mL/min   Anion gap 13 5 - 15  Urinalysis, Routine w reflex microscopic (not at [facility])  Result Value Ref Range   Color, Urine RED (A) YELLOW   APPearance TURBID (A) CLEAR   Specific Gravity, Urine 1.027 1.005 - 1.030   pH 5.5 5.0 - 8.0   Glucose, UA NEGATIVE NEGATIVE mg/dL   Hgb urine dipstick LARGE (A) NEGATIVE   Bilirubin Urine MODERATE (A) NEGATIVE   Ketones, ur >80 (A) NEGATIVE mg/dL   Protein, ur 413 (A) NEGATIVE mg/dL   Nitrite NEGATIVE NEGATIVE   Leukocytes, UA MODERATE (A) NEGATIVE  Lithium level  Result Value Ref Range   Lithium Lvl <0.06 (L) 0.60 - 1.20 mmol/L  Urine microscopic-add on  Result Value Ref  Range   Squamous Epithelial / LPF TOO NUMEROUS TO COUNT (A) NONE SEEN   WBC, UA 6-30 0 - 5 WBC/hpf   RBC / HPF TOO NUMEROUS TO COUNT 0 - 5 RBC/hpf   Bacteria, UA MANY (A) NONE SEEN   Urine-Other MUCOUS PRESENT   POC urine preg, ED (not at [facility])  Result Value Ref Range   Preg Test, Ur NEGATIVE NEGATIVE    US Renal  11/25/2015  CLINICAL DATA:  Right flank pain EXAM: RENAL / URINARY TRACT ULTRASOUND COMPLETE COMPARISON:  None. FINDINGS: Right Kidney: Length: 14 cm. Echogenicity within normal limits. No mass. Mild-moderate right hydronephrosis. Left Kidney: Length: 13.2 cm. Echogenicity within normal limits. No mass or hydronephrosis visualized. Bladder: Appears normal for degree of bladder distention. Right ureteral jet not visualized. IMPRESSION: 1.  Mild-moderate right hydronephrosis. Electronically Signed   By: Elige Ko   On: 11/25/2015 14:43    Radiology and laboratory examinations were reviewed by  me and used in medical decision making if performed.   MDM:  Patient with equivocal findings on UA with signs of contamination. Not septic appearing, preserved renal function, no evidence of bilateral stones, lithium level is undetectable. I recommended that the patient could take NSAIDs for adjunctive pain therapy pending urology follow-up. I discussed the case with Dr. Vernie Ammonsttelin who recommended culturing and empiric treatment with Cipro to have the patient return with fevers or other signs of worsening infection. Plan to follow up with urology and return precautions discussed for worsening or new concerning symptoms.   Diagnoses that have been ruled out:  None  Diagnoses that are still under consideration:  None  Final diagnoses:  Kidney stone     Lyndal Pulleyaniel Carmelina Balducci, MD 11/25/15 1850

## 2015-11-25 NOTE — ED Notes (Signed)
RN starting IV, drawing labs 

## 2015-11-25 NOTE — ED Notes (Signed)
Per pt, states she woke up this am to dull right flank pain-states painful urination as well

## 2015-11-25 NOTE — ED Notes (Signed)
Per NT Verlon AuLeslie, patient has made attempts on bedpan to give urine sample

## 2015-11-25 NOTE — Discharge Instructions (Signed)
Kidney Stones °Kidney stones (urolithiasis) are deposits that form inside your kidneys. The intense pain is caused by the stone moving through the urinary tract. When the stone moves, the ureter goes into spasm around the stone. The stone is usually passed in the urine.  °CAUSES  °· A disorder that makes certain neck glands produce too much parathyroid hormone (primary hyperparathyroidism). °· A buildup of uric acid crystals, similar to gout in your joints. °· Narrowing (stricture) of the ureter. °· A kidney obstruction present at birth (congenital obstruction). °· Previous surgery on the kidney or ureters. °· Numerous kidney infections. °SYMPTOMS  °· Feeling sick to your stomach (nauseous). °· Throwing up (vomiting). °· Blood in the urine (hematuria). °· Pain that usually spreads (radiates) to the groin. °· Frequency or urgency of urination. °DIAGNOSIS  °· Taking a history and physical exam. °· Blood or urine tests. °· CT scan. °· Occasionally, an examination of the inside of the urinary bladder (cystoscopy) is performed. °TREATMENT  °· Observation. °· Increasing your fluid intake. °· Extracorporeal shock wave lithotripsy--This is a noninvasive procedure that uses shock waves to break up kidney stones. °· Surgery Nachtigal be needed if you have severe pain or persistent obstruction. There are various surgical procedures. Most of the procedures are performed with the use of small instruments. Only small incisions are needed to accommodate these instruments, so recovery time is minimized. °The size, location, and chemical composition are all important variables that will determine the proper choice of action for you. Talk to your health care provider to better understand your situation so that you will minimize the risk of injury to yourself and your kidney.  °HOME CARE INSTRUCTIONS  °· Drink enough water and fluids to keep your urine clear or pale yellow. This will help you to pass the stone or stone fragments. °· Strain  all urine through the provided strainer. Keep all particulate matter and stones for your health care provider to see. The stone causing the pain Arreola be as small as a grain of salt. It is very important to use the strainer each and every time you pass your urine. The collection of your stone will allow your health care provider to analyze it and verify that a stone has actually passed. The stone analysis will often identify what you can do to reduce the incidence of recurrences. °· Only take over-the-counter or prescription medicines for pain, discomfort, or fever as directed by your health care provider. °· Keep all follow-up visits as told by your health care provider. This is important. °· Get follow-up X-rays if required. The absence of pain does not always mean that the stone has passed. It Dancy have only stopped moving. If the urine remains completely obstructed, it can cause loss of kidney function or even complete destruction of the kidney. It is your responsibility to make sure X-rays and follow-ups are completed. Ultrasounds of the kidney can show blockages and the status of the kidney. Ultrasounds are not associated with any radiation and can be performed easily in a matter of minutes. °· Make changes to your daily diet as told by your health care provider. You Nipp be told to: °¨ Limit the amount of salt that you eat. °¨ Eat 5 or more servings of fruits and vegetables each day. °¨ Limit the amount of meat, poultry, fish, and eggs that you eat. °· Collect a 24-hour urine sample as told by your health care provider. You Badley need to collect another urine sample every 6-12   months. °SEEK MEDICAL CARE IF: °· You experience pain that is progressive and unresponsive to any pain medicine you have been prescribed. °SEEK IMMEDIATE MEDICAL CARE IF:  °· Pain cannot be controlled with the prescribed medicine. °· You have a fever or shaking chills. °· The severity or intensity of pain increases over 18 hours and is not  relieved by pain medicine. °· You develop a new onset of abdominal pain. °· You feel faint or pass out. °· You are unable to urinate. °  °This information is not intended to replace advice given to you by your health care provider. Make sure you discuss any questions you have with your health care provider. °  °Document Released: 06/17/2005 Document Revised: 03/08/2015 Document Reviewed: 11/18/2012 °Elsevier Interactive Patient Education ©2016 [employer] ° °

## 2015-11-25 NOTE — ED Provider Notes (Signed)
CSN: [id number]     Arrival date & time 11/25/15  1302 History   First MD Initiated Contact with Patient 11/25/15 1303     Chief Complaint  Patient presents with  . Flank Pain     (Consider location/radiation/quality/duration/timing/severity/associated sxs/prior Treatment) Patient is a 35 y.o. female presenting with flank pain. The history is provided by the patient.  Flank Pain This is a new problem. The current episode started 1 to 2 hours ago. The problem occurs constantly. The problem has been gradually worsening. Associated symptoms comments: Right flank pain radiating to the lower abd. Nothing aggravates the symptoms. Nothing relieves the symptoms. She has tried nothing for the symptoms. The treatment provided no relief.    Past Medical History  Diagnosis Date  . Obese   . PCOS (polycystic ovarian syndrome)   . PONV (postoperative nausea and vomiting)   . Postpartum care following vaginal delivery (7/22) 01/21/2012  . Maternal anemia complicating pregnancy, childbirth, or the puerperium 01/21/2012   Past Surgical History  Procedure Laterality Date  . Pylonidal cyst    . Breast surgery  2012    reduction   Family History  Problem Relation Age of Onset  . Cancer Maternal Grandmother     breast  . Diabetes Paternal Grandmother   . Diabetes Paternal Grandfather    Social History  Substance Use Topics  . Smoking status: Never Smoker   . Smokeless tobacco: Never Used  . Alcohol Use: No   OB History    Gravida Para Term Preterm AB TAB SAB Ectopic Multiple Living   0 0 0 0 0 0 2     Review of Systems  Gastrointestinal: Positive for nausea.       Intermittent nausea 5-6 days ago which has resolved.  Not related to pain today  Genitourinary: Positive for flank pain.  All other systems reviewed and are negative.     Allergies  Erythromycin and Other  Home Medications   Prior to Admission medications   Medication Sig Start Date End Date Taking? Authorizing  Provider  ferrous sulfate 325 (65 FE) MG tablet Take 325 mg by mouth daily with breakfast.    Historical Provider, MD  Prenatal Vit-Fe Fumarate-FA (PRENATAL MULTIVITAMIN) TABS Take 1 tablet by mouth daily.    Historical Provider, MD   There were no vitals taken for this visit. Physical Exam  Constitutional: She is oriented to person, place, and time. She appears well-developed and well-nourished. She appears distressed.  Appears uncomfortable  HENT:  Head: Normocephalic and atraumatic.  Mouth/Throat: Oropharynx is clear and moist.  Eyes: Conjunctivae and EOM are normal. Pupils are equal, round, and reactive to light.  Neck: Normal range of motion. Neck supple.  Cardiovascular: Normal rate, regular rhythm and intact distal pulses.   No murmur heard. Pulmonary/Chest: Effort normal and breath sounds normal. No respiratory distress. She has no wheezes. She has no rales.  Abdominal: Soft. She exhibits no distension. There is tenderness. There is no rebound and no guarding.  Minimal right flank tenderness  Musculoskeletal: Normal range of motion. She exhibits no edema or tenderness.  Neurological: She is alert and oriented to person, place, and time.  Skin: Skin is warm and dry. No rash noted. No erythema.  Psychiatric: She has a normal mood and affect. Her behavior is normal.  Nursing note and vitals reviewed.   ED Course  Procedures (including critical care time) Labs Review Labs Reviewed  CBC WITH DIFFERENTIAL/PLATELET - Abnormal; Notable for  the following:    WBC 14.3 (*)    Neutro Abs 12.0 (*)    All other components within normal limits  BASIC METABOLIC PANEL - Abnormal; Notable for the following:    Glucose, Bld 117 (*)    Creatinine, Ser 1.02 (*)    All other components within normal limits  LITHIUM LEVEL - Abnormal; Notable for the following:    Lithium Lvl <0.06 (*)    All other components within normal limits  URINALYSIS, ROUTINE W REFLEX MICROSCOPIC (NOT AT [facility])  POC  URINE PREG, ED    Imaging Review [place] Renal  11/25/2015  CLINICAL DATA:  Right flank pain EXAM: RENAL / URINARY TRACT ULTRASOUND COMPLETE COMPARISON:  None. FINDINGS: Right Kidney: Length: 14 cm. Echogenicity within normal limits. No mass. Mild-moderate right hydronephrosis. Left Kidney: Length: 13.2 cm. Echogenicity within normal limits. No mass or hydronephrosis visualized. Bladder: Appears normal for degree of bladder distention. Right ureteral jet not visualized. IMPRESSION: 1.  Mild-moderate right hydronephrosis. Electronically Signed   By: Elige KoHetal  Patel   On: 11/25/2015 14:43   I have personally reviewed and evaluated these images and lab results as part of my medical decision-making.   EKG Interpretation None      MDM   Final diagnoses:  Kidney stone    Pt with symptoms consistent with kidney stone.  Denies infectious sx, or GI symptoms.  Low concern for diverticulitis and no risk factors or history suggestive of AAA.  No hx suggestive of GU source (discharge) and otherwise pt is healthy.  Will hydrate, treat pain and ensure no infection with UA, CBC, CMP and will get stone study to further eval.  3:46 PM Ultrasound is consistent with mild to moderate right hydronephrosis with mild leukocytosis of 14,000 and UA pending. After 2 rounds of pain medication patient is feeling better. Pain now down to a 4 out of 10. Will await for urine keep pain controlled    Gwyneth SproutWhitney Shanna Un, MD 11/25/15 1651

## 2015-11-26 LAB — URINE CULTURE

## 2015-12-04 ENCOUNTER — Other Ambulatory Visit: Payer: Self-pay | Admitting: Urology

## 2015-12-13 ENCOUNTER — Encounter (HOSPITAL_BASED_OUTPATIENT_CLINIC_OR_DEPARTMENT_OTHER): Payer: Self-pay | Admitting: *Deleted

## 2015-12-14 NOTE — Progress Notes (Addendum)
UNABLE TO REACH PT VIA PHONE.  ALSO UNABLE TO LEAVE MESSAGE WITH INSTRUCTION'S AT THIS TIME, VOICE MAILBOX IS FULL.  NEEDS HISTORY DONE IN EPIC ON ARRIVAL. PT HAD CURRENT LAB RESULTS IN CHART AND EPIC.  HAD SPOKEN TO OR SCHEUDLER , SELITA,  EARLIER TODAY AND STATED THAT DR MANNY STATED PT STILL HAD STONE AND SURGERY IS AS SCHEDULED BECAUSE THOUGHT SHE HAD PASSED STONE EARLIER THIS WEEK.

## 2015-12-15 ENCOUNTER — Encounter (HOSPITAL_BASED_OUTPATIENT_CLINIC_OR_DEPARTMENT_OTHER): Admission: RE | Payer: Self-pay | Source: Ambulatory Visit

## 2015-12-15 ENCOUNTER — Ambulatory Visit (HOSPITAL_BASED_OUTPATIENT_CLINIC_OR_DEPARTMENT_OTHER): Admission: RE | Admit: 2015-12-15 | Payer: 59 | Source: Ambulatory Visit | Admitting: Urology

## 2015-12-15 HISTORY — DX: Calculus of ureter: N20.1

## 2015-12-15 SURGERY — CYSTOURETEROSCOPY, WITH RETROGRADE PYELOGRAM AND STENT INSERTION
Anesthesia: General | Laterality: Right

## 2016-05-09 ENCOUNTER — Other Ambulatory Visit: Payer: Self-pay | Admitting: Obstetrics & Gynecology

## 2016-05-09 DIAGNOSIS — E281 Androgen excess: Secondary | ICD-10-CM

## 2016-05-14 ENCOUNTER — Other Ambulatory Visit: Payer: 59

## 2016-08-20 DIAGNOSIS — G4733 Obstructive sleep apnea (adult) (pediatric): Secondary | ICD-10-CM | POA: Insufficient documentation

## 2018-12-01 ENCOUNTER — Ambulatory Visit (HOSPITAL_COMMUNITY)
Admission: EM | Admit: 2018-12-01 | Discharge: 2018-12-01 | Disposition: A | Discharge status: Home / Self Care | Payer: Medicaid Other | Attending: Family Medicine | Admitting: Family Medicine

## 2018-12-01 ENCOUNTER — Ambulatory Visit (INDEPENDENT_AMBULATORY_CARE_PROVIDER_SITE_OTHER): Payer: Medicaid Other

## 2018-12-01 ENCOUNTER — Encounter (HOSPITAL_COMMUNITY): Payer: Self-pay | Admitting: Emergency Medicine

## 2018-12-01 DIAGNOSIS — S99922A Unspecified injury of left foot, initial encounter: Secondary | ICD-10-CM

## 2018-12-01 DIAGNOSIS — W1842XA Slipping, tripping and stumbling without falling due to stepping into hole or opening, initial encounter: Secondary | ICD-10-CM | POA: Diagnosis not present

## 2018-12-01 DIAGNOSIS — S93602A Unspecified sprain of left foot, initial encounter: Secondary | ICD-10-CM

## 2018-12-01 NOTE — ED Triage Notes (Signed)
Pt presents to [facility] for assessment of left foot pain after tripping in a hole two weeks ago.

## 2018-12-01 NOTE — ED Notes (Signed)
Patient able to ambulate independently  

## 2018-12-01 NOTE — ED Provider Notes (Signed)
[facility]   [id number] 12/01/18 Arrival Time: 1718  ASSESSMENT & PLAN:  1. Sprain of left foot, initial encounter    I have personally viewed the imaging studies ordered this visit. No fractures appreciated.  Without neurological or vascular changes. Prefers OTC ibuprofen 600-800 mg TID with food.  Orders Placed This Encounter  Procedures  . DG Foot Complete Left  . Apply cam walker   WBAT  Follow-up Information    [facility].   Why:  If not improving over the next 1-2 weeks. Contact information: [address] [place] [address] [phone]         No work note needed.  Reviewed expectations re: course of current medical issues. Questions answered. Outlined signs and symptoms indicating need for more acute intervention. Patient verbalized understanding. After Visit Summary given.  SUBJECTIVE: History from: patient. [name] is a 38 y.o. female who reports persistent moderate pain of her lateral left foot; described as aching without radiation. Onset: abrupt, 2 w ago. Injury/trama: yes, noted pain after 'tripping in a hole in my yard'; ambulatory since. Symptoms have progressed to a point and plateaued since beginning. Aggravating factors: weight bearing; prolonged standing. Alleviating factors: rest. Associated symptoms: none reported. Extremity sensation changes or weakness: none. Self treatment: has not tried OTCs for relief of pain. History of similar: no.  Past Surgical History:  Procedure Laterality Date  . BREAST SURGERY  2012   reduction  . pylonidal cyst      ROS: As per HPI. All other systems negative.   OBJECTIVE:  Vitals:   12/01/18 1752  BP: 130/87  Pulse: 88  Resp: 18  Temp: 98.2 F (36.8 C)  TempSrc: Oral  SpO2: 99%    General appearance: alert; no distress HEENT: [place]; AT Neck: supple with FROM Resp: unlabored respirations Extremities: . LLE: warm and well  perfused; fairly well localized moderate tenderness over left lateral foot; without gross deformities; with mild swelling; with no bruising; ROM: normal with reported discomfort CV: brisk extremity capillary refill of LLE; 2+ DP and PT pulse of LLE. Skin: warm and dry; no visible rashes Neurologic: gait normal but favors L foot; normal reflexes of RLE and LLE; normal sensation of RLE and LLE; normal strength of RLE and LLE Psychological: alert and cooperative; normal mood and affect  Imaging: Dg Foot Complete Left  Result Date: 12/01/2018 CLINICAL DATA:  Stepped in hole 2 weeks ago. Persistent lateral foot pain. Initial encounter. EXAM: LEFT FOOT - COMPLETE 3+ VIEW COMPARISON:  None. FINDINGS: There is no evidence of fracture or dislocation. There is no evidence of arthropathy or other focal bone abnormality. Small plantar and dorsal calcaneal bone spurs are noted. Soft tissues are unremarkable. IMPRESSION: No acute findings. Electronically Signed   By: Myles Rosenthal M.D.   On: 12/01/2018 18:22    Allergies  Allergen Reactions  . Erythromycin Nausea And Vomiting  . Morphine And Related Itching and Other (See Comments)    Other reaction (unknown) that caused medication to be DC by provider.  . Other Other (See Comments)    Banana peppers, inability to move followed by vomitting    Past Medical History:  Diagnosis Date  . PCOS (polycystic ovarian syndrome)   . PONV (postoperative nausea and vomiting)   . Right ureteral stone    Social History   Socioeconomic History  . Marital status: Legally Separated    Spouse name: Not on file  . Number  of children: Not on file  . Years of education: Not on file  . Highest education level: Not on file  Occupational History  . Not on file  Social Needs  . Financial resource strain: Not on file  . Food insecurity:    Worry: Not on file    Inability: Not on file  . Transportation needs:    Medical: Not on file    Non-medical: Not on file   Tobacco Use  . Smoking status: Never Smoker  . Smokeless tobacco: Never Used  Substance and Sexual Activity  . Alcohol use: No  . Drug use: No  . Sexual activity: Yes  Lifestyle  . Physical activity:    Days per week: Not on file    Minutes per session: Not on file  . Stress: Not on file  Relationships  . Social connections:    Talks on phone: Not on file    Gets together: Not on file    Attends religious service: Not on file    Active member of club or organization: Not on file    Attends meetings of clubs or organizations: Not on file    Relationship status: Not on file  Other Topics Concern  . Not on file  Social History Narrative  . Not on file   Family History  Problem Relation Age of Onset  . Cancer Maternal Grandmother        breast  . Diabetes Paternal Grandmother   . Diabetes Paternal Grandfather    Past Surgical History:  Procedure Laterality Date  . BREAST SURGERY  2012   reduction  . pylonidal cyst        Mardella LaymanHagler, Sudeep Scheibel, MD 12/02/18 1055

## 2018-12-01 NOTE — Discharge Instructions (Addendum)
You Carraway use over the counter ibuprofen or acetaminophen as needed.  ° °

## 2018-12-10 ENCOUNTER — Ambulatory Visit: Payer: Medicaid Other | Admitting: Sports Medicine

## 2018-12-10 ENCOUNTER — Encounter: Payer: Self-pay | Admitting: Sports Medicine

## 2018-12-10 ENCOUNTER — Other Ambulatory Visit: Payer: Self-pay

## 2018-12-10 VITALS — BP 100/76 | Ht 64.0 in | Wt 260.0 lb

## 2018-12-10 DIAGNOSIS — M25572 Pain in left ankle and joints of left foot: Secondary | ICD-10-CM | POA: Diagnosis present

## 2018-12-10 MED ORDER — MELOXICAM 15 MG PO TABS: ORAL_TABLET | ORAL | 2 refills | Status: DC

## 2018-12-10 MED ORDER — DICLOFENAC SODIUM 75 MG PO TBEC: DELAYED_RELEASE_TABLET | ORAL | 0 refills | Status: DC

## 2018-12-10 NOTE — Patient Instructions (Signed)
Your ankle and foot pain is due to ankle sprain and irritation of the tendons in the lateral foot.  We called a prescription into your pharmacy for diclofenac 75 mg twice daily.  Take this scheduled for next week then you can use it as needed. You [name] also use Tylenol as needed Ice 15 minutes 3-4 times per day Recommend compression ankle sleeve for additional support Continue to use the walking boot as needed if you are foot/ankle is painful.  You do not need to wear it at night or when sitting around the house. We will have you follow-up in 3 weeks

## 2018-12-10 NOTE — Progress Notes (Signed)
  [name] - 37 y.o. female MRN [id number]  Date of birth: 1980/07/14    SUBJECTIVE:      Chief Complaint: Ankle pain  HPI:  38 year old female 3 weeks of lateral foot/ankle pain.  Patient was walking across the lawn and stepped in a hole.  This resulted in inversion of the ankle.  She noticed more severe pain 3 days after injury and went to urgent care.  X-rays were performed and she was placed in a walking boot.  She has been wearing a walking boot for the past 1.5 weeks.  She does note continued pain over the lateral foot and ankle.  This is worse with walking and weightbearing.  She denies any significant swelling or erythema.  She occasionally feels some tingling in the lateral 2 toes, but no persistent numbness.  No skin changes   ROS:     See HPI. All other reviewed systems negative.  PERTINENT  PMH / PSH FH / / SH:  Past Medical, Surgical, Social, and Family History Reviewed & Updated in the EMR.     OBJECTIVE: BP 100/76   Ht 5\' 4"  (1.626 m)   Wt 260 lb (117.9 kg)   BMI 44.63 kg/m   Physical Exam:  Vital signs are reviewed.  GEN: Alert and oriented, NAD Pulm: Breathing unlabored PSY: normal mood, congruent affect  MSK: Left ankle: - Inspection: No obvious deformity, erythema, swelling, or ecchymosis - Palpation: Tenderness at the base of the fifth metatarsal and proximally along the peroneal tendons - Strength: Normal strength with dorsiflexion, plantarflexion, inversion, and eversion.  No significant pain with strength testing - ROM: Slightly limited range of motion in dorsiflexion compared to the right ankle - Neuro/vasc: NV intact - Special Tests: 1+ anterior drawer,  Negative syndesmotic compression.  MSK [place]: Limited ultrasound of the lateral left ankle performed.  No cortical regularity or swelling along the fifth metatarsal.  The peroneus brevis and longus visualized without signs of tears, tenosynovitis or thickening.  Peroneus brevis is intact to his insertion on  the base of the fifth metatarsal.  Right ankle: No obvious deformity No tenderness Full range of motion with 5/5 strength N/V intact distally  ASSESSMENT & PLAN:  1.  Left ankle/foot pain secondary to inversion injury.  X-rays from urgent care were independently reviewed today.  No acute bony abnormalities.  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  Suspect that in addition to ankle sprain, she aggravated the peroneal tendons, more specifically at the area of the accessory ossicle as this increases the risk. - Recommend ankle compression sleeve - We will continue walking boot as needed for pain -Diclofenac 75 mg twice daily -Ice 15 minutes 3-4 times per day - Gentle range of motion exercises -Follow-up in 3 weeks.  MRI if not improving  Patient seen and evaluated with the sports medicine fellow.  I agree with the above plan of care.  Treatment as above and follow-up in 3 weeks.  If symptoms persist, consider merits of further diagnostic imaging.  Patient will call with questions or concerns in the interim.

## 2018-12-10 NOTE — Addendum Note (Signed)
Addended by: Cyd Silence on: 12/10/2018 12:24 PM   Modules accepted: Orders

## 2018-12-31 ENCOUNTER — Ambulatory Visit: Payer: Medicaid Other | Admitting: Sports Medicine

## 2019-01-04 ENCOUNTER — Other Ambulatory Visit: Payer: Self-pay

## 2019-01-04 ENCOUNTER — Encounter: Payer: Self-pay | Admitting: Podiatry

## 2019-01-04 ENCOUNTER — Ambulatory Visit: Payer: Medicaid Other | Admitting: Podiatry

## 2019-01-04 VITALS — Temp 98.6°F

## 2019-01-04 DIAGNOSIS — B351 Tinea unguium: Secondary | ICD-10-CM

## 2019-01-04 DIAGNOSIS — L600 Ingrowing nail: Secondary | ICD-10-CM

## 2019-01-04 MED ORDER — NEOMYCIN-POLYMYXIN-HC 3.5-10000-1 OT SOLN: OTIC | 0 refills | Status: DC

## 2019-01-04 NOTE — Progress Notes (Signed)
   Subjective:    Patient ID: [name], female    DOB: 09/18/1980, 38 y.o.   MRN: [id number]  HPI    Review of Systems  All other systems reviewed and are negative.      Objective:   Physical Exam        Assessment & Plan:

## 2019-01-04 NOTE — Patient Instructions (Signed)
Soak Instructions    THE DAY AFTER THE PROCEDURE  Place 1/4 cup of epsom salts in a quart of warm tap water.  Submerge your foot or feet with outer bandage intact for the initial soak; this will allow the bandage to become moist and wet for easy lift off.  Once you remove your bandage, continue to soak in the solution for 20 minutes.  This soak should be done twice a day.  Next, remove your foot or feet from solution, blot dry the affected area and cover.  You Emanuele use a band aid large enough to cover the area or use gauze and tape.  Apply other medications to the area as directed by the doctor such as polysporin neosporin.  IF YOUR SKIN BECOMES IRRITATED WHILE USING THESE INSTRUCTIONS, IT IS OKAY TO SWITCH TO  WHITE VINEGAR AND WATER. Or you Fager use antibacterial soap and water to keep the toe clean  Monitor for any signs/symptoms of infection. Call the office immediately if any occur or go directly to the emergency room. Call with any questions/concerns.    Long Term Care Instructions-Post Nail Surgery  You have had your ingrown toenail and root treated with a chemical.  This chemical causes a burn that will drain and ooze like a blister.  This can drain for 6-8 weeks or longer.  It is important to keep this area clean, covered, and follow the soaking instructions dispensed at the time of your surgery.  This area will eventually dry and form a scab.  Once the scab forms you no longer need to soak or apply a dressing.  If at any time you experience an increase in pain, redness, swelling, or drainage, you should contact the office as soon as possible.  

## 2019-01-04 NOTE — Progress Notes (Signed)
Subjective:   Patient ID: [name], female   DOB: 38 y.o.   MRN: [id number]   HPI Patient presents stating my toenail got severely traumatized yesterday it is been painful and I cannot wear shoe gear.  States that it was stepped on and has been giving her a problem.  Patient does not smoke likes to be active   Review of Systems  All other systems reviewed and are negative.       Objective:  Physical Exam Vitals signs and nursing note reviewed.  Constitutional:      Appearance: She is well-developed.  Pulmonary:     Effort: Pulmonary effort is normal.  Musculoskeletal: Normal range of motion.  Skin:    General: Skin is warm.  Neurological:     Mental Status: She is alert.     Neurovascular status found to be intact muscle strength found to be adequate range of motion within normal limits with patient found to have a severely damaged left hallux nail that is very loose and painful with palpation     Assessment:  Damage left hallux nail bed with moderate pain associated with it and looseness F2     Plan:  Reviewed condition discussed and at great length.  I do the nail removal allowing new nail to regrow and I explained that there Markwell be abnormal problems with it ultimately Brier require permanent procedure.  Today I infiltrated the left hallux 60 mg like Marcaine mixture removed the left hallux nail flushed out the bed and applied sterile dressing and will allow to regrow and see how she responds

## 2019-01-05 ENCOUNTER — Telehealth: Payer: Self-pay | Admitting: *Deleted

## 2019-01-05 ENCOUNTER — Encounter: Payer: Self-pay | Admitting: Podiatry

## 2019-01-05 NOTE — Telephone Encounter (Signed)
I called pt and asked if she had performed the soaks yet and she said yes and had gotten a little relief. I asked pt if the toe was the same color as the rest and she stated yes and I told her the symptoms Pogue be the anesthesia leaving the toe and to continue the soaks and the topical antibiotic and I transferred to the scheduler.

## 2019-01-05 NOTE — Telephone Encounter (Signed)
Pt states she is having some pain from the toenail procedure yesterday, and the pain is worse when she is laying down and she has not begun the soaks yet.

## 2019-01-14 ENCOUNTER — Encounter: Payer: Self-pay | Admitting: Podiatry

## 2019-01-14 ENCOUNTER — Ambulatory Visit: Payer: Medicaid Other | Admitting: Podiatry

## 2019-01-14 ENCOUNTER — Other Ambulatory Visit: Payer: Self-pay

## 2019-01-14 VITALS — Temp 98.1°F

## 2019-01-14 DIAGNOSIS — L600 Ingrowing nail: Secondary | ICD-10-CM

## 2019-01-14 NOTE — Progress Notes (Signed)
Subjective:   Patient ID: [name], female   DOB: 38 y.o.   MRN: [id number]   HPI Patient states the nail is doing fine just wanted it looked at   ROS      Objective:  Physical Exam  Neurovascular status intact with well-healed nail site     Assessment:  Doing well post nail surgery     Plan:  Discharge reappoint as needed

## 2019-06-15 ENCOUNTER — Other Ambulatory Visit: Payer: Self-pay

## 2019-06-15 ENCOUNTER — Ambulatory Visit: Payer: Medicaid Other | Admitting: Sports Medicine

## 2019-06-15 VITALS — BP 124/82 | Ht 64.0 in | Wt 260.0 lb

## 2019-06-15 DIAGNOSIS — M216X2 Other acquired deformities of left foot: Secondary | ICD-10-CM | POA: Diagnosis present

## 2019-06-15 MED ORDER — MELOXICAM 15 MG PO TABS: ORAL_TABLET | ORAL | 0 refills | Status: AC

## 2019-06-15 NOTE — Assessment & Plan Note (Signed)
Prior foot x-rays reviewed, along with physical exam indicating moderate loss of transverse arch of left foot.  5 days of Mobic 15 mg daily, will fit patient for tarsal pad.  Linse follow-up in a week if there is no improvement and might consider further imaging with possibility of MRI

## 2019-06-15 NOTE — Progress Notes (Signed)
    Subjective:  [name] is a 38 y.o. female who presents to the [facility] today with a chief complaint of left foot pain for 3 to 4 weeks.   HPI: Patient with moderate constant left foot pain for 3 to 4 weeks.  No specific traumatic injury or point known the start of the pain.  Denies any swelling or erythema.  Still able to walk and do most daily activities.  No significant change in activity level prior to this.  No change in shoes.  Pain was slow in onset.  She has not found any aggravating or alleviating factors.  Describes this as a aching and sometimes shooting pain that starts around mid foot on the medial side and radiates to both dorsal and plantar surface of the MTP joints with occasional radiation along the dorsal surface of the foot up to immediately superior to the ankle.  Denies any consistent ankle pain or knee involvement.  Has not been taking any pain medication for this.  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  Prior charted lateral ankle injury from earlier this year has been fully resolved patient does not think it is contributory  Objective:  Physical Exam: BP 124/82   Ht 5\' 4"  (1.626 m)   Wt 260 lb (117.9 kg)   BMI 44.63 kg/m   Gen: NAD, pleasant and conversing comfortably Pulm: NWOB, no cough MSK:  Left foot/ankle: Inspection: No skin change or erythema, moderate loss of transverse arch on standing, no valgus or varus deformation of left ankle Palpation: No crepitus or pathologic erythema.  Some mild tenderness along median of first tarsal bone, pain most significant along plantar surface of foot at insertion of plantar fascia to MTP of second through fourth toes.   ROM: Full range of motion with no restriction Strength: Strength still intact Stability: No indication of instability Special tests: n/a Neurovascular: No neurovascular deficits Skin: warm, dry Neuro: grossly normal, moves all extremities Psych: Normal affect  and thought content Prior left foot x-rays reviewed with no significant sign of arthritis, prior noted ossicle was on lateral side of foot and thought to be unrelated  No results found for this or any previous visit (from the past 72 hour(s)).   Assessment/Plan:  Loss of transverse plantar arch of left foot Prior foot x-rays reviewed, along with physical exam indicating moderate loss of transverse arch of left foot.  5 days of Mobic 15 mg daily, will fit patient for tarsal pad.  Bronaugh follow-up in a week if there is no improvement and might consider further imaging with possibility of MRI   Joy Sires, DO FAMILY MEDICINE RESIDENT - PGY3 06/15/2019 3:32 PM  Patient seen and evaluated with resident.  I agree with the above plan of care.  Hopefully pain will resolve over the next week or so.  Follow-up for ongoing or recalcitrant issues.

## 2019-12-01 ENCOUNTER — Encounter: Payer: Self-pay | Admitting: Podiatry

## 2019-12-10 ENCOUNTER — Telehealth: Payer: Self-pay | Admitting: Podiatry

## 2019-12-10 NOTE — Telephone Encounter (Signed)
lvm for patient to call and schedule an appointment

## 2019-12-22 ENCOUNTER — Other Ambulatory Visit: Payer: Self-pay

## 2019-12-22 ENCOUNTER — Ambulatory Visit: Payer: Medicaid Other | Admitting: Sports Medicine

## 2019-12-22 ENCOUNTER — Encounter: Payer: Self-pay | Admitting: Sports Medicine

## 2019-12-22 VITALS — BP 118/84 | Ht 64.0 in | Wt 270.0 lb

## 2019-12-22 DIAGNOSIS — M25562 Pain in left knee: Secondary | ICD-10-CM

## 2019-12-22 NOTE — Progress Notes (Addendum)
PCP: Deloris Ping, MD  Subjective:   HPI: Patient is a 39 y.o. female here for evaluation of left knee pain.  The pain started yesterday.  As she was getting out of her car she twisted her knee and suddenly felt pain.  She denies any popping.  She denies any mechanical symptoms.  No instability.  Pain is located along the medial joint line does not radiate.  She believes the medial joint line of her knee appears swollen.  No numbness or tingling.  She has not had any issues with that knee previously.  She took 1 meloxicam yesterday and notes this did not improve the pain yet.   Review of Systems: See HPI above.  Past Medical History:  Diagnosis Date  . PCOS (polycystic ovarian syndrome)   . PONV (postoperative nausea and vomiting)   . Right ureteral stone     Current Outpatient Medications on File Prior to Visit  Medication Sig Dispense Refill  . levonorgestrel (MIRENA) 20 MCG/24HR IUD 1 each by Intrauterine route once.    . meloxicam (MOBIC) 15 MG tablet Take 1 tablet daily with food for 5 days. Then take as needed. 30 tablet 0  . spironolactone (ALDACTONE) 25 MG tablet TK 1 T PO QD     No current facility-administered medications on file prior to visit.    Past Surgical History:  Procedure Laterality Date  . BREAST SURGERY  2012   reduction  . pylonidal cyst      Allergies  Allergen Reactions  . Erythromycin Nausea And Vomiting  . Morphine And Related Itching and Other (See Comments)    Other reaction (unknown) that caused medication to be DC by provider.  . Other Other (See Comments)    Banana peppers, inability to move followed by vomitting    Social History   Socioeconomic History  . Marital status: Significant Other    Spouse name: Not on file  . Number of children: Not on file  . Years of education: Not on file  . Highest education level: Not on file  Occupational History  . Not on file  Tobacco Use  . Smoking status: Never Smoker  . Smokeless  tobacco: Never Used  Substance and Sexual Activity  . Alcohol use: No  . Drug use: No  . Sexual activity: Yes  Other Topics Concern  . Not on file  Social History Narrative  . Not on file   Social Determinants of Health   Financial Resource Strain:   . Difficulty of Paying Living Expenses:   Food Insecurity:   . Worried About Programme researcher, broadcasting/film/video in the Last Year:   . Barista in the Last Year:   Transportation Needs:   . Freight forwarder (Medical):   Marland Kitchen Lack of Transportation (Non-Medical):   Physical Activity:   . Days of Exercise per Week:   . Minutes of Exercise per Session:   Stress:   . Feeling of Stress :   Social Connections:   . Frequency of Communication with Friends and Family:   . Frequency of Social Gatherings with Friends and Family:   . Attends Religious Services:   . Active Member of Clubs or Organizations:   . Attends Banker Meetings:   Marland Kitchen Marital Status:   Intimate Partner Violence:   . Fear of Current or Ex-Partner:   . Emotionally Abused:   Marland Kitchen Physically Abused:   . Sexually Abused:     Family History  Problem Relation Age of Onset  . Cancer Maternal Grandmother        breast  . Diabetes Paternal Grandmother   . Diabetes Paternal Grandfather         Objective:  Physical Exam: BP 118/84   Ht 5\' 4"  (1.626 m)   Wt 270 lb (122.5 kg)   BMI 46.35 kg/m  Gen: NAD, comfortable in exam room Lungs: Breathing comfortably on room air Knee Exam Left -Inspection: no deformity, no discoloration -Palpation: Tenderness palpation at the medial joint line -ROM: Extension: -10 degrees; Flexion: 150 degrees -Strength: Extension: 5/5; Flexion: 5/5 -Special Tests: Varus Stress: Negative; Valgus Stress: Negative; Lachman: Negative; Posterior drawer: Negative; McMurray: Negative; Thessaly: Negative; Patellar grind: Negative -Limb neurovascularly intact, no instability noted  Limited diagnostic ultrasound left knee Findings: -No  fluid noted within the suprapatellar pouch -Normal appearance of the quadricep and patellar tendon -Normal appearance of the medial lateral meniscus -There was some edema noted around the medial meniscus despite there not being any medial meniscus tears that were seen. -Normal appearance of trochlear groove Impression: -Soft tissue swelling noted surrounding the medial meniscus, however no tears were noted.  The remainder of the ultrasound examination was normal   Assessment & Plan:  Patient is a 39 y.o. female here for evaluation of left knee pain  1.  Left knee pain -Ultrasound examination showing mild edema along the medial joint line although no discrete meniscus tears were seen -Patient will take meloxicam daily for the next 2 weeks -Patient given home exercise program -Patient Doolen ice the knee as needed for the next several days  Patient will follow up in the next 2 to 4 weeks if there is no improvement.  If she still having pain or develops mechanical symptoms we Michelsen consider MRI for evaluation of possible medial meniscus tear  I was the preceptor for this visit and available for immediate consultation Shellia Cleverly, DO

## 2019-12-22 NOTE — Patient Instructions (Addendum)
On ultrasound I saw a small amount of swelling by your medial meniscus but no tears.  There is no fluid located within the knee.  It is likely that you have bruised the meniscus when you twisted your knee. -Take meloxicam once a day for the next 2 weeks to calm down some of the inflammation -Work on the home exercise program shown to you today -You Sanville ice the knee for the next several days  I will see you back in the next 2 to 4 weeks if your pain does not improve

## 2020-03-09 ENCOUNTER — Ambulatory Visit: Payer: Medicaid Other | Attending: Internal Medicine

## 2020-03-09 DIAGNOSIS — Z23 Encounter for immunization: Secondary | ICD-10-CM

## 2020-03-09 NOTE — Progress Notes (Signed)
   Covid-19 Vaccination Clinic  Name:  [name]    MRN: [id number] DOB: 1980/10/27  03/09/2020  [name] was observed post Covid-19 immunization for 15 minutes without incident. She was provided with Vaccine Information Sheet and instruction to access the V-Safe system.   [name] was instructed to call 911 with any severe reactions post vaccine: Marland Kitchen Difficulty breathing  . Swelling of face and throat  . A fast heartbeat  . A bad rash all over body  . Dizziness and weakness   Immunizations Administered    Name Date Dose VIS Date Route   Pfizer COVID-19 Vaccine 03/09/2020  2:49 PM 0.3 mL 08/25/2018 Intramuscular   Manufacturer: [employer], [employer]   Lot: [id number]   NDC: [address]-8

## 2020-03-30 ENCOUNTER — Ambulatory Visit: Payer: Medicaid Other | Attending: Internal Medicine

## 2020-03-30 DIAGNOSIS — Z23 Encounter for immunization: Secondary | ICD-10-CM

## 2020-03-30 NOTE — Progress Notes (Signed)
   Covid-19 Vaccination Clinic  Name:  [name]    MRN: [id number] DOB: 02/11/1981  03/30/2020  [name] was observed post Covid-19 immunization for 15 minutes without incident. She was provided with Vaccine Information Sheet and instruction to access the V-Safe system.   [name] was instructed to call 911 with any severe reactions post vaccine: Marland Kitchen Difficulty breathing  . Swelling of face and throat  . A fast heartbeat  . A bad rash all over body  . Dizziness and weakness   Immunizations Administered    Name Date Dose VIS Date Route   Pfizer COVID-19 Vaccine 03/30/2020  3:28 PM 0.3 mL 08/25/2018 Intramuscular   Manufacturer: [employer], [employer]   Lot: [id number]   NDC: [id number]

## 2021-06-28 ENCOUNTER — Emergency Department (HOSPITAL_BASED_OUTPATIENT_CLINIC_OR_DEPARTMENT_OTHER)
Admission: EM | Admit: 2021-06-28 | Discharge: 2021-06-28 | Disposition: A | Discharge status: Home / Self Care | Payer: Medicaid Other | Attending: Emergency Medicine | Admitting: Emergency Medicine

## 2021-06-28 ENCOUNTER — Emergency Department (HOSPITAL_BASED_OUTPATIENT_CLINIC_OR_DEPARTMENT_OTHER): Payer: Medicaid Other | Admitting: Radiology

## 2021-06-28 ENCOUNTER — Other Ambulatory Visit: Payer: Self-pay

## 2021-06-28 ENCOUNTER — Encounter (HOSPITAL_BASED_OUTPATIENT_CLINIC_OR_DEPARTMENT_OTHER): Payer: Self-pay | Admitting: Emergency Medicine

## 2021-06-28 DIAGNOSIS — R519 Headache, unspecified: Secondary | ICD-10-CM | POA: Insufficient documentation

## 2021-06-28 DIAGNOSIS — Y92410 Unspecified street and highway as the place of occurrence of the external cause: Secondary | ICD-10-CM | POA: Diagnosis not present

## 2021-06-28 DIAGNOSIS — M542 Cervicalgia: Secondary | ICD-10-CM | POA: Insufficient documentation

## 2021-06-28 DIAGNOSIS — M79602 Pain in left arm: Secondary | ICD-10-CM | POA: Diagnosis not present

## 2021-06-28 DIAGNOSIS — M25512 Pain in left shoulder: Secondary | ICD-10-CM | POA: Insufficient documentation

## 2021-06-28 DIAGNOSIS — M25511 Pain in right shoulder: Secondary | ICD-10-CM | POA: Insufficient documentation

## 2021-06-28 DIAGNOSIS — M79644 Pain in right finger(s): Secondary | ICD-10-CM | POA: Insufficient documentation

## 2021-06-28 DIAGNOSIS — M25562 Pain in left knee: Secondary | ICD-10-CM | POA: Diagnosis not present

## 2021-06-28 MED ORDER — METHOCARBAMOL 500 MG PO TABS: 500.0000 mg | ORAL_TABLET | Freq: Two times a day (BID) | ORAL | 0 refills | Status: AC
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
## 2021-06-30 ENCOUNTER — Other Ambulatory Visit: Payer: Self-pay

## 2021-06-30 ENCOUNTER — Ambulatory Visit (INDEPENDENT_AMBULATORY_CARE_PROVIDER_SITE_OTHER): Payer: Medicaid Other

## 2021-06-30 ENCOUNTER — Other Ambulatory Visit: Payer: Self-pay | Admitting: Chiropractic Medicine

## 2021-06-30 DIAGNOSIS — M542 Cervicalgia: Secondary | ICD-10-CM

## 2021-06-30 DIAGNOSIS — M545 Low back pain, unspecified: Secondary | ICD-10-CM
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
## 2022-11-21 ENCOUNTER — Ambulatory Visit (INDEPENDENT_AMBULATORY_CARE_PROVIDER_SITE_OTHER): Payer: Managed Care, Other (non HMO) | Admitting: Podiatry

## 2022-11-21 DIAGNOSIS — L603 Nail dystrophy: Secondary | ICD-10-CM | POA: Diagnosis not present

## 2022-11-21 DIAGNOSIS — L600 Ingrowing nail: Secondary | ICD-10-CM

## 2022-11-21 NOTE — Progress Notes (Signed)
Subjective:  Patient ID: [name], female    DOB: September 25, 1980,  MRN: [id number]  Chief Complaint  Patient presents with   Nail Problem    Patient would like to have bilateral hallux nails removed because of possible injuries to nails due to the shoes she wears at work. Patient has some blood underneath the nails. Patient nails were tested for fungus and it was negative.     42 y.o. female presents with concern for dystrophy of the bilateral hallux nail.  She is interested in possibly having them move.  She has had microtrauma and damage from shoe gear that she has to wear for work.  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  Past Medical History:  Diagnosis Date   PCOS (polycystic ovarian syndrome)    PONV (postoperative nausea and vomiting)    Right ureteral stone     Allergies  Allergen Reactions   Erythromycin Nausea And Vomiting   Morphine And Codeine Itching and Other (See Comments)    Other reaction (unknown) that caused medication to be DC by provider.   Other Other (See Comments)    Banana peppers, inability to move followed by vomitting    ROS: Negative except as per HPI above  Objective:  General: AAO x3, NAD  Dermatological: Dystrophy of the bilateral hallux nail with thickening discoloration onycholysis present.  Mildly tender with palpation due to onycholysis  Vascular:  Dorsalis Pedis artery and Posterior Tibial artery pedal pulses are 2/4 bilateral.  Capillary fill time < 3 sec to all digits.   Neruologic: Grossly intact via light touch bilateral. Protective threshold intact to all sites bilateral.   Musculoskeletal: No gross boney pedal deformities bilateral. No pain, crepitus, or limitation noted with foot and ankle range of motion bilateral. Muscular strength 5/5 in all groups tested bilateral.  Gait: Unassisted, Nonantalgic.   No images are attached to the encounter.   Assessment:    1. Ingrown nail of great toe of right foot   2. Ingrown nail of great toe of left foot   3. Nail dystrophy      Plan:  Patient was evaluated and treated and all questions answered.  Ingrown Nail, bilaterally -Patient elects to proceed with temporary avulsion wants to give him a chance to grow back normally and wear different shoes to prevent microtrauma -Patient elects to proceed with minor surgery to remove ingrown toenail today. Consent reviewed and signed by patient. -Ingrown nail excised. See procedure note. -Educated on post-procedure care including soaking. Written instructions provided and reviewed. -Patient to follow up in 2 weeks for nail check.  Procedure: Excision of Ingrown Toenail Location: Bilateral 1st toe  total  nail borders. Anesthesia: Lidocaine 1% plain; 1.5 mL and Marcaine 0.5% plain; 1.5 mL, digital block. Skin Prep: Betadine. Dressing: Silvadene; telfa; dry, sterile, compression dressing. Technique: Following skin prep, the toe was exsanguinated and a tourniquet was secured at the base of the toe. The affected nail border was freed, split with a nail splitter, and excised.  Nailbed was irrigated out with alcohol. The tourniquet was then removed and sterile dressing applied. Disposition: Patient tolerated procedure well. Patient to return in 2 weeks for follow-up.    Return in about 2 weeks (around 12/05/2022) for nail check.          Corinna Gab, DPM [facility] / [facility]

## 2022-11-21 NOTE — Patient Instructions (Signed)
Soak Instructions    THE DAY AFTER THE PROCEDURE  Place 1/4 cup of epsom salts (or betadine, or white vinegar) in a quart of warm tap water.  Submerge your foot or feet with outer bandage intact for the initial soak; this will allow the bandage to become moist and wet for easy lift off.  Once you remove your bandage, continue to soak in the solution for 20 minutes.  This soak should be done twice a day.  Next, remove your foot or feet from solution, blot dry the affected area and cover.  You Greiner use a band aid large enough to cover the area or use gauze and tape.  Apply other medications to the area as directed by the doctor such as polysporin neosporin.  IF YOUR SKIN BECOMES IRRITATED WHILE USING THESE INSTRUCTIONS, IT IS OKAY TO SWITCH TO  WHITE VINEGAR AND WATER. Or you Pardon use antibacterial soap and water to keep the toe clean  Monitor for any signs/symptoms of infection. Call the office immediately if any occur or go directly to the emergency room. Call with any questions/concerns.    Long Term Care Instructions-Post Nail Surgery  You have had your ingrown toenail and root treated with a chemical.  This chemical causes a burn that will drain and ooze like a blister.  This can drain for 6-8 weeks or longer.  It is important to keep this area clean, covered, and follow the soaking instructions dispensed at the time of your surgery.  This area will eventually dry and form a scab.  Once the scab forms you no longer need to soak or apply a dressing.  If at any time you experience an increase in pain, redness, swelling, or drainage, you should contact the office as soon as possible.  

## 2022-12-06 ENCOUNTER — Ambulatory Visit (INDEPENDENT_AMBULATORY_CARE_PROVIDER_SITE_OTHER): Payer: Managed Care, Other (non HMO) | Admitting: Podiatry

## 2022-12-06 DIAGNOSIS — L603 Nail dystrophy: Secondary | ICD-10-CM | POA: Diagnosis not present

## 2022-12-06 DIAGNOSIS — L600 Ingrowing nail: Secondary | ICD-10-CM

## 2022-12-06 NOTE — Progress Notes (Signed)
Subjective: [name] is a 42 y.o.  female returns to office today for follow up evaluation after having  Hallux total nail ingrown removal avulsion approximately 2 weeks ago. Patient has been soaking using epsom salts and applying topical antibiotic covered with bandaid daily. Patient denies fevers, chills, nausea, vomiting. Denies any calf pain, chest pain, SOB.   Objective:  Vitals: Reviewed  General: Well developed, nourished, in no acute distress, alert and oriented x3   Dermatology: Skin is warm, dry and supple bilateral. bilateral hallux nail bed appears to be clean, dry, with mild granular tissue and surrounding scab. There is no surrounding erythema, edema, drainage/purulence. The remaining nails appear unremarkable at this time. There are no other lesions or other signs of infection present.  Neurovascular status: Intact. No lower extremity swelling; No pain with calf compression bilateral.  Musculoskeletal: Decreased tenderness to palpation of the bilateral hallux nail bed. Muscular strength within normal limits bilateral.   Assesement and Plan: S/p total nail avulsion to the  bilateral hallux nail  doing well.   -Continue soaking in epsom salts twice a day followed by antibiotic ointment and a band-aid. Can leave uncovered at night. Continue this until completely healed.  -If the area has not healed in 2 weeks, call the office for follow-up appointment, or sooner if any problems arise.  -Monitor for any signs/symptoms of infection. Call the office immediately if any occur or go directly to the emergency room. Call with any questions/concerns.        Corinna Gab, DPM [facility] / [facility]                   12/06/2022

## 2023-01-21 ENCOUNTER — Encounter (HOSPITAL_BASED_OUTPATIENT_CLINIC_OR_DEPARTMENT_OTHER): Payer: Self-pay

## 2023-01-21 ENCOUNTER — Other Ambulatory Visit: Payer: Self-pay

## 2023-01-21 DIAGNOSIS — R112 Nausea with vomiting, unspecified: Secondary | ICD-10-CM | POA: Insufficient documentation

## 2023-01-21 DIAGNOSIS — T383X5A Adverse effect of insulin and oral hypoglycemic [antidiabetic] drugs, initial encounter: Secondary | ICD-10-CM | POA: Insufficient documentation

## 2023-01-21 LAB — COMPREHENSIVE METABOLIC PANEL
ALT: 13 U/L (ref 0–44)
AST: 13 U/L — ABNORMAL LOW (ref 15–41)
Albumin: 4.5 g/dL (ref 3.5–5.0)
Alkaline Phosphatase: 95 U/L (ref 38–126)
Anion gap: 10 (ref 5–15)
BUN: 11 mg/dL (ref 6–20)
CO2: 28 mmol/L (ref 22–32)
Calcium: 9.5 mg/dL (ref 8.9–10.3)
Chloride: 100 mmol/L (ref 98–111)
Creatinine, Ser: 0.77 mg/dL (ref 0.44–1.00)
GFR, Estimated: >60 mL/min (ref >60)
Glucose, Bld: 96 mg/dL (ref 70–99)
Potassium: 4 mmol/L (ref 3.5–5.1)
Sodium: 138 mmol/L (ref 135–145)
Total Bilirubin: 0.9 mg/dL (ref 0.3–1.2)
Total Protein: 8.6 g/dL — ABNORMAL HIGH (ref 6.5–8.1)

## 2023-01-21 LAB — CBC
HCT: 42.2 % (ref 36.0–46.0)
Hemoglobin: 13.8 g/dL (ref 12.0–15.0)
MCH: 28.5 pg (ref 26.0–34.0)
MCHC: 32.7 g/dL (ref 30.0–36.0)
MCV: 87.2 fL (ref 80.0–100.0)
Platelets: 329 10*3/uL (ref 150–400)
RBC: 4.84 MIL/uL (ref 3.87–5.11)
RDW: 13.6 % (ref 11.5–15.5)
WBC: 14.3 10*3/uL — ABNORMAL HIGH (ref 4.0–10.5)
nRBC: 0 % (ref 0.0–0.2)

## 2023-01-21 MED ORDER — ONDANSETRON HCL 4 MG/2ML IJ SOLN
4.0000 mg | Freq: Once | INTRAMUSCULAR | Status: AC | PRN
  Administered 2023-01-21: 4 mg via INTRAVENOUS
  Filled 2023-01-21: qty 2

## 2023-01-21 NOTE — ED Triage Notes (Signed)
Increased Wegovy dose yesterday Since then severe N/V since injection +HA

## 2023-01-21 NOTE — ED Notes (Signed)
Pt unable to void at this time. 

## 2023-01-22 ENCOUNTER — Emergency Department (HOSPITAL_BASED_OUTPATIENT_CLINIC_OR_DEPARTMENT_OTHER)
Admission: EM | Admit: 2023-01-22 | Discharge: 2023-01-22 | Disposition: A | Discharge status: Home / Self Care | Payer: Managed Care, Other (non HMO) | Attending: Emergency Medicine | Admitting: Emergency Medicine

## 2023-01-22 DIAGNOSIS — R112 Nausea with vomiting, unspecified: Secondary | ICD-10-CM

## 2023-01-22 DIAGNOSIS — T50905A Adverse effect of unspecified drugs, medicaments and biological substances, initial encounter: Secondary | ICD-10-CM

## 2023-01-22 MED ORDER — ONDANSETRON 8 MG PO TBDP: ORAL_TABLET | ORAL | 0 refills | Status: AC

## 2023-01-22 MED ORDER — ONDANSETRON HCL 4 MG/2ML IJ SOLN
4.0000 mg | Freq: Once | INTRAMUSCULAR | Status: AC
Start: 2023-01-22 — End: 2023-01-22
  Administered 2023-01-22: 4 mg via INTRAVENOUS
  Filled 2023-01-22: qty 2

## 2023-01-22 MED ORDER — SODIUM CHLORIDE 0.9 % IV BOLUS
500.0000 mL | Freq: Once | INTRAVENOUS | Status: AC
Start: 2023-01-22 — End: 2023-01-22
  Administered 2023-01-22: 500 mL via INTRAVENOUS

## 2023-01-22 MED ORDER — SODIUM CHLORIDE 0.9 % IV BOLUS: 500.0000 mL | Freq: Once | INTRAVENOUS | Status: DC

## 2023-01-22 NOTE — ED Provider Notes (Signed)
[place] EMERGENCY DEPARTMENT AT [facility] Provider Note   CSN: [id number] Arrival date & time: 01/21/23  2240     History  Chief Complaint  Patient presents with   Emesis    [name] is a 42 y.o. female.  The history is provided by the patient.  Emesis Severity:  Moderate Timing:  Intermittent Quality:  Stomach contents Progression:  Unchanged Chronicity:  New Context: not post-tussive and not self-induced   Relieved by:  Nothing Worsened by:  Nothing Ineffective treatments:  None tried Associated symptoms: no abdominal pain, no diarrhea, no fever and no URI   Risk factors comment:  Increased dose on GLP 1     Past Medical History:  Diagnosis Date   PCOS (polycystic ovarian syndrome)    PONV (postoperative nausea and vomiting)    Right ureteral stone      Home Medications Prior to Admission medications   Medication Sig Start Date End Date Taking? Authorizing Provider  ondansetron (ZOFRAN-ODT) 8 MG disintegrating tablet 8mg  ODT q8 hours prn nausea 01/22/23  Yes Roxana Lai, MD  levonorgestrel (MIRENA) 20 MCG/24HR IUD 1 each by Intrauterine route once.    [provider]  meloxicam (MOBIC) 15 MG tablet Take 1 tablet daily with food for 5 days. Then take as needed. 06/15/19   Ralene Cork, DO  methocarbamol (ROBAXIN) 500 MG tablet Take 1 tablet (500 mg total) by mouth 2 (two) times daily. 06/28/21   Cristopher Peru, PA-C  spironolactone (ALDACTONE) 25 MG tablet TK 1 T PO QD 12/03/18   [provider]      Allergies    Erythromycin, Morphine and codeine, and Other    Review of Systems   Review of Systems  Constitutional:  Negative for fever.  HENT:  Negative for facial swelling.   Gastrointestinal:  Positive for vomiting. Negative for abdominal pain, constipation and diarrhea.  Genitourinary:  Negative for dysuria.  All other systems reviewed and are negative.   Physical Exam Updated Vital Signs BP 114/81   Pulse 69    Temp 97.8 F (36.6 C)   Resp 18   Ht 5\' 4"  (1.626 m)   Wt 114.3 kg   SpO2 98%   BMI 43.26 kg/m  Physical Exam Vitals and nursing note reviewed.  Constitutional:      General: She is not in acute distress.    Appearance: Normal appearance. She is well-developed.  HENT:     Head: Normocephalic and atraumatic.     Nose: Nose normal.  Eyes:     Pupils: Pupils are equal, round, and reactive to light.  Cardiovascular:     Rate and Rhythm: Normal rate and regular rhythm.     Pulses: Normal pulses.     Heart sounds: Normal heart sounds.  Pulmonary:     Effort: Pulmonary effort is normal. No respiratory distress.     Breath sounds: Normal breath sounds.  Abdominal:     General: Bowel sounds are normal. There is no distension.     Palpations: Abdomen is soft.     Tenderness: There is no abdominal tenderness. There is no guarding or rebound.  Musculoskeletal:        General: Normal range of motion.     Cervical back: Normal range of motion and neck supple.  Skin:    General: Skin is warm and dry.     Capillary Refill: Capillary refill takes less than 2 seconds.     Findings: No erythema or rash.  Neurological:     General: No focal deficit present.     Mental Status: She is alert.     Deep Tendon Reflexes: Reflexes normal.  Psychiatric:        Mood and Affect: Mood normal.     ED Results / Procedures / Treatments   Labs (all labs ordered are listed, but only abnormal results are displayed) Results for orders placed or performed during the hospital encounter of 01/22/23  Comprehensive metabolic panel  Result Value Ref Range   Sodium 138 135 - 145 mmol/L   Potassium 4.0 3.5 - 5.1 mmol/L   Chloride 100 98 - 111 mmol/L   CO2 28 22 - 32 mmol/L   Glucose, Bld 96 70 - 99 mg/dL   BUN 11 6 - 20 mg/dL   Creatinine, Ser 5.78 0.44 - 1.00 mg/dL   Calcium 9.5 8.9 - 46.9 mg/dL   Total Protein 8.6 (H) 6.5 - 8.1 g/dL   Albumin 4.5 3.5 - 5.0 g/dL   AST 13 (L) 15 - 41 U/L   ALT 13 0 -  44 U/L   Alkaline Phosphatase 95 38 - 126 U/L   Total Bilirubin 0.9 0.3 - 1.2 mg/dL   GFR, Estimated >62 >95 mL/min   Anion gap 10 5 - 15  CBC  Result Value Ref Range   WBC 14.3 (H) 4.0 - 10.5 K/uL   RBC 4.84 3.87 - 5.11 MIL/uL   Hemoglobin 13.8 12.0 - 15.0 g/dL   HCT 28.4 13.2 - 44.0 %   MCV 87.2 80.0 - 100.0 fL   MCH 28.5 26.0 - 34.0 pg   MCHC 32.7 30.0 - 36.0 g/dL   RDW 10.2 72.5 - 36.6 %   Platelets 329 150 - 400 K/uL   nRBC 0.0 0.0 - 0.2 %   No results found.  None  Radiology No results found.  Procedures Procedures    Medications Ordered in ED Medications  ondansetron (ZOFRAN) injection 4 mg (4 mg Intravenous Given 01/21/23 2303)  sodium chloride 0.9 % bolus 500 mL (0 mLs Intravenous Stopped 01/22/23 0501)  ondansetron (ZOFRAN) injection 4 mg (4 mg Intravenous Given 01/22/23 0350)    ED Course/ Medical Decision Making/ A&P                             Medical Decision Making Patient increased dose on GLP1 now with nausea and emesis   Amount and/or Complexity of Data Reviewed External Data Reviewed: notes.    Details: Previous notes reviewed  Labs: ordered.    Details: Normal sodium 138, normal potassium 4, normal creatinine .77.  White count slight elevation 14.3, normal hemoglobin 13.8, normal platelets   Risk Prescription drug management. Risk Details: Well appearing abdominal exam is benign and reassuring.  White count secondary to demargination from emesis.  This is a known side effect of GLP1 better post medication. Stop this medication and talk to your doctor.  RX for zofran.  Strict return    Final Clinical Impression(s) / ED Diagnoses Final diagnoses:  Medication side effect, initial encounter  Nausea and vomiting, unspecified vomiting type   Return for intractable cough, coughing up blood, fevers > 100.4 unrelieved by medication, shortness of breath, intractable vomiting, chest pain, shortness of breath, weakness, numbness, changes in speech,  facial asymmetry, abdominal pain, passing out, Inability to tolerate liquids or food, cough, altered mental status or any concerns. No signs of systemic illness or infection. The  patient is nontoxic-appearing on exam and vital signs are within normal limits.  I have reviewed the triage vital signs and the nursing notes. Pertinent labs & imaging results that were available during my care of the patient were reviewed by me and considered in my medical decision making (see chart for details). After history, exam, and medical workup I feel the patient has been appropriately medically screened and is safe for discharge home. Pertinent diagnoses were discussed with the patient. Patient was given return precautions Rx / DC Orders ED Discharge Orders          Ordered    ondansetron (ZOFRAN-ODT) 8 MG disintegrating tablet        01/22/23 0506              Samariyah Cowles, MD 01/22/23 3664
